# Patient Record
Sex: Female | Born: 1980 | Hispanic: No | Marital: Married | Smoking: Former smoker
Health system: Southern US, Community
[De-identification: ages and names within clinical notes are randomized; demographics above are authoritative.]

## PROBLEM LIST (undated history)

## (undated) ENCOUNTER — Inpatient Hospital Stay (HOSPITAL_COMMUNITY): Admission: EM | Payer: Medicaid Other | Source: Home / Self Care

## (undated) ENCOUNTER — Inpatient Hospital Stay (HOSPITAL_COMMUNITY): Payer: Medicaid Other

## (undated) DIAGNOSIS — K219 Gastro-esophageal reflux disease without esophagitis: Secondary | ICD-10-CM

## (undated) DIAGNOSIS — Z9889 Other specified postprocedural states: Secondary | ICD-10-CM

## (undated) DIAGNOSIS — F909 Attention-deficit hyperactivity disorder, unspecified type: Secondary | ICD-10-CM

## (undated) DIAGNOSIS — T4145XA Adverse effect of unspecified anesthetic, initial encounter: Secondary | ICD-10-CM

## (undated) DIAGNOSIS — R011 Cardiac murmur, unspecified: Secondary | ICD-10-CM

## (undated) DIAGNOSIS — K5900 Constipation, unspecified: Secondary | ICD-10-CM

## (undated) DIAGNOSIS — K8020 Calculus of gallbladder without cholecystitis without obstruction: Secondary | ICD-10-CM

## (undated) DIAGNOSIS — T8859XA Other complications of anesthesia, initial encounter: Secondary | ICD-10-CM

## (undated) DIAGNOSIS — M1990 Unspecified osteoarthritis, unspecified site: Secondary | ICD-10-CM

## (undated) DIAGNOSIS — Z98811 Dental restoration status: Secondary | ICD-10-CM

## (undated) DIAGNOSIS — R000 Tachycardia, unspecified: Secondary | ICD-10-CM

## (undated) DIAGNOSIS — D1802 Hemangioma of intracranial structures: Secondary | ICD-10-CM

## (undated) HISTORY — DX: Gastro-esophageal reflux disease without esophagitis: K21.9

## (undated) HISTORY — DX: Tachycardia, unspecified: R00.0

## (undated) HISTORY — DX: Cardiac murmur, unspecified: R01.1

## (undated) HISTORY — DX: Unspecified osteoarthritis, unspecified site: M19.90

---
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
## 2000-02-09 ENCOUNTER — Inpatient Hospital Stay (HOSPITAL_COMMUNITY): Admission: AD | Admit: 2000-02-09 | Discharge: 2000-02-09 | Payer: Self-pay | Admitting: Obstetrics & Gynecology

## 2000-03-16 ENCOUNTER — Ambulatory Visit (HOSPITAL_COMMUNITY): Admission: RE | Admit: 2000-03-16 | Discharge: 2000-03-16 | Payer: Self-pay | Admitting: Obstetrics & Gynecology

## 2000-07-30 ENCOUNTER — Inpatient Hospital Stay (HOSPITAL_COMMUNITY): Admission: AD | Admit: 2000-07-30 | Discharge: 2000-07-30 | Payer: Self-pay | Admitting: *Deleted

## 2000-08-20 ENCOUNTER — Encounter (HOSPITAL_COMMUNITY): Admission: RE | Admit: 2000-08-20 | Discharge: 2000-08-20 | Payer: Self-pay | Admitting: Obstetrics & Gynecology

## 2000-08-21 ENCOUNTER — Inpatient Hospital Stay (HOSPITAL_COMMUNITY): Admission: AD | Admit: 2000-08-21 | Discharge: 2000-08-24 | Payer: Self-pay | Admitting: *Deleted

## 2000-09-06 ENCOUNTER — Encounter (INDEPENDENT_AMBULATORY_CARE_PROVIDER_SITE_OTHER): Payer: Self-pay | Admitting: *Deleted

## 2000-10-03 ENCOUNTER — Inpatient Hospital Stay (HOSPITAL_COMMUNITY): Admission: EM | Admit: 2000-10-03 | Discharge: 2000-10-04 | Payer: Self-pay | Admitting: Emergency Medicine

## 2000-10-05 ENCOUNTER — Encounter: Admission: RE | Admit: 2000-10-05 | Discharge: 2000-10-05 | Payer: Self-pay | Admitting: Family Medicine

## 2000-12-24 ENCOUNTER — Emergency Department (HOSPITAL_COMMUNITY): Admission: EM | Admit: 2000-12-24 | Discharge: 2000-12-24 | Payer: Self-pay | Admitting: Emergency Medicine

## 2004-01-07 HISTORY — PX: LAPAROSCOPY: SHX197

## 2004-01-18 ENCOUNTER — Ambulatory Visit (HOSPITAL_COMMUNITY): Admission: RE | Admit: 2004-01-18 | Discharge: 2004-01-18 | Payer: Self-pay | Admitting: Obstetrics and Gynecology

## 2004-03-16 ENCOUNTER — Inpatient Hospital Stay (HOSPITAL_COMMUNITY): Admission: AD | Admit: 2004-03-16 | Discharge: 2004-03-16 | Payer: Self-pay | Admitting: Obstetrics & Gynecology

## 2004-11-12 ENCOUNTER — Ambulatory Visit: Payer: Self-pay | Admitting: Family Medicine

## 2004-11-27 ENCOUNTER — Ambulatory Visit: Payer: Self-pay | Admitting: *Deleted

## 2005-02-17 ENCOUNTER — Ambulatory Visit: Payer: Self-pay | Admitting: Internal Medicine

## 2005-03-13 ENCOUNTER — Ambulatory Visit: Payer: Self-pay | Admitting: Family Medicine

## 2005-10-28 ENCOUNTER — Ambulatory Visit: Payer: Self-pay | Admitting: Family Medicine

## 2006-02-09 ENCOUNTER — Ambulatory Visit: Payer: Self-pay | Admitting: Family Medicine

## 2006-02-23 ENCOUNTER — Ambulatory Visit: Payer: Self-pay | Admitting: Family Medicine

## 2006-03-06 ENCOUNTER — Encounter (INDEPENDENT_AMBULATORY_CARE_PROVIDER_SITE_OTHER): Payer: Self-pay | Admitting: *Deleted

## 2006-08-19 ENCOUNTER — Ambulatory Visit: Payer: Self-pay | Admitting: Internal Medicine

## 2006-08-25 ENCOUNTER — Inpatient Hospital Stay (HOSPITAL_COMMUNITY): Admission: AD | Admit: 2006-08-25 | Discharge: 2006-08-25 | Payer: Self-pay | Admitting: Obstetrics & Gynecology

## 2006-08-26 ENCOUNTER — Ambulatory Visit: Payer: Self-pay | Admitting: Internal Medicine

## 2006-09-23 ENCOUNTER — Encounter (INDEPENDENT_AMBULATORY_CARE_PROVIDER_SITE_OTHER): Payer: Self-pay | Admitting: *Deleted

## 2006-10-16 ENCOUNTER — Ambulatory Visit (HOSPITAL_COMMUNITY): Admission: RE | Admit: 2006-10-16 | Discharge: 2006-10-16 | Payer: Self-pay | Admitting: Obstetrics & Gynecology

## 2007-02-04 ENCOUNTER — Ambulatory Visit (HOSPITAL_COMMUNITY): Admission: RE | Admit: 2007-02-04 | Discharge: 2007-02-04 | Payer: Self-pay | Admitting: Family Medicine

## 2007-03-04 ENCOUNTER — Inpatient Hospital Stay (HOSPITAL_COMMUNITY): Admission: AD | Admit: 2007-03-04 | Discharge: 2007-03-04 | Payer: Self-pay | Admitting: Family Medicine

## 2007-03-04 ENCOUNTER — Ambulatory Visit: Payer: Self-pay | Admitting: Advanced Practice Midwife

## 2007-03-09 ENCOUNTER — Ambulatory Visit (HOSPITAL_COMMUNITY): Admission: RE | Admit: 2007-03-09 | Discharge: 2007-03-09 | Payer: Self-pay | Admitting: Obstetrics and Gynecology

## 2007-03-14 ENCOUNTER — Ambulatory Visit: Payer: Self-pay | Admitting: Obstetrics and Gynecology

## 2007-03-14 ENCOUNTER — Inpatient Hospital Stay (HOSPITAL_COMMUNITY): Admission: AD | Admit: 2007-03-14 | Discharge: 2007-03-16 | Payer: Self-pay | Admitting: Obstetrics and Gynecology

## 2007-05-03 ENCOUNTER — Encounter (INDEPENDENT_AMBULATORY_CARE_PROVIDER_SITE_OTHER): Payer: Self-pay | Admitting: Family Medicine

## 2007-05-03 ENCOUNTER — Ambulatory Visit: Payer: Self-pay | Admitting: Internal Medicine

## 2007-05-03 LAB — CONVERTED CEMR LAB
Cholesterol: 188 mg/dL (ref 0–200)
LDL Cholesterol: 105 mg/dL — ABNORMAL HIGH (ref 0–99)
VLDL: 26 mg/dL (ref 0–40)

## 2007-05-10 ENCOUNTER — Ambulatory Visit: Payer: Self-pay | Admitting: Internal Medicine

## 2008-03-13 ENCOUNTER — Encounter (INDEPENDENT_AMBULATORY_CARE_PROVIDER_SITE_OTHER): Payer: Self-pay | Admitting: Family Medicine

## 2008-03-13 ENCOUNTER — Ambulatory Visit: Payer: Self-pay | Admitting: Family Medicine

## 2008-03-13 LAB — CONVERTED CEMR LAB
AST: 18 units/L (ref 0–37)
Albumin: 4.2 g/dL (ref 3.5–5.2)
Alkaline Phosphatase: 65 units/L (ref 39–117)
Amylase: 66 units/L (ref 0–105)
Band Neutrophils: 0 % (ref 0–10)
Basophils Absolute: 0 10*3/uL (ref 0.0–0.1)
Basophils Relative: 0 % (ref 0–1)
HDL: 77 mg/dL (ref >39)
Helicobacter Pylori Antibody-IgG: 5.8 — ABNORMAL HIGH
LDL Cholesterol: 127 mg/dL — ABNORMAL HIGH (ref 0–99)
Lymphocytes Relative: 30 % (ref 12–46)
Neutro Abs: 4.6 10*3/uL (ref 1.7–7.7)
Neutrophils Relative %: 62 % (ref 43–77)
Platelets: 280 10*3/uL (ref 150–400)
Potassium: 4.1 meq/L (ref 3.5–5.3)
Preg, Serum: NEGATIVE
Sodium: 138 meq/L (ref 135–145)
Total Bilirubin: 0.9 mg/dL (ref 0.3–1.2)
Total Protein: 6.6 g/dL (ref 6.0–8.3)
VLDL: 15 mg/dL (ref 0–40)
WBC: 7.4 10*3/uL (ref 4.0–10.5)

## 2008-03-16 ENCOUNTER — Ambulatory Visit (HOSPITAL_COMMUNITY): Admission: RE | Admit: 2008-03-16 | Discharge: 2008-03-16 | Payer: Self-pay | Admitting: Internal Medicine

## 2008-04-10 ENCOUNTER — Ambulatory Visit: Payer: Self-pay | Admitting: Internal Medicine

## 2008-04-17 ENCOUNTER — Ambulatory Visit (HOSPITAL_COMMUNITY): Admission: RE | Admit: 2008-04-17 | Discharge: 2008-04-17 | Payer: Self-pay | Admitting: Family Medicine

## 2008-04-26 ENCOUNTER — Emergency Department (HOSPITAL_COMMUNITY): Admission: EM | Admit: 2008-04-26 | Discharge: 2008-04-27 | Payer: Self-pay | Admitting: Emergency Medicine

## 2008-05-10 ENCOUNTER — Ambulatory Visit: Payer: Self-pay | Admitting: Internal Medicine

## 2008-08-18 ENCOUNTER — Ambulatory Visit: Payer: Self-pay | Admitting: Internal Medicine

## 2008-08-18 ENCOUNTER — Encounter (INDEPENDENT_AMBULATORY_CARE_PROVIDER_SITE_OTHER): Payer: Self-pay | Admitting: Adult Health

## 2008-08-18 ENCOUNTER — Telehealth (INDEPENDENT_AMBULATORY_CARE_PROVIDER_SITE_OTHER): Payer: Self-pay | Admitting: *Deleted

## 2008-08-18 LAB — CONVERTED CEMR LAB
AST: 15 units/L (ref 0–37)
Albumin: 4.4 g/dL (ref 3.5–5.2)
Alkaline Phosphatase: 54 units/L (ref 39–117)
BUN: 10 mg/dL (ref 6–23)
Basophils Absolute: 0 10*3/uL (ref 0.0–0.1)
Basophils Relative: 1 % (ref 0–1)
Creatinine, Ser: 0.8 mg/dL (ref 0.40–1.20)
Eosinophils Relative: 1 % (ref 0–5)
Glucose, Bld: 79 mg/dL (ref 70–99)
HCT: 42.3 % (ref 36.0–46.0)
Helicobacter Pylori Antibody-IgG: 2.4 — ABNORMAL HIGH
Hemoglobin: 14 g/dL (ref 12.0–15.0)
Lymphocytes Relative: 43 % (ref 12–46)
MCHC: 33.1 g/dL (ref 30.0–36.0)
Monocytes Absolute: 0.4 10*3/uL (ref 0.1–1.0)
Platelets: 288 10*3/uL (ref 150–400)
Potassium: 4.2 meq/L (ref 3.5–5.3)
RDW: 12.5 % (ref 11.5–15.5)
Total Bilirubin: 1.5 mg/dL — ABNORMAL HIGH (ref 0.3–1.2)

## 2008-09-13 ENCOUNTER — Ambulatory Visit: Payer: Self-pay | Admitting: Internal Medicine

## 2008-09-13 ENCOUNTER — Encounter (INDEPENDENT_AMBULATORY_CARE_PROVIDER_SITE_OTHER): Payer: Self-pay | Admitting: Adult Health

## 2008-09-13 ENCOUNTER — Encounter (INDEPENDENT_AMBULATORY_CARE_PROVIDER_SITE_OTHER): Payer: Self-pay | Admitting: *Deleted

## 2008-09-13 LAB — CONVERTED CEMR LAB
Chlamydia, DNA Probe: NEGATIVE
GC Probe Amp, Genital: NEGATIVE

## 2008-09-14 ENCOUNTER — Encounter (INDEPENDENT_AMBULATORY_CARE_PROVIDER_SITE_OTHER): Payer: Self-pay | Admitting: *Deleted

## 2008-09-14 LAB — CONVERTED CEMR LAB
Cholesterol: 207 mg/dL — ABNORMAL HIGH (ref 0–200)
HDL: 71 mg/dL (ref >39)
LDL Cholesterol: 121 mg/dL — ABNORMAL HIGH (ref 0–99)
Triglycerides: 73 mg/dL (ref <150)

## 2008-09-19 ENCOUNTER — Ambulatory Visit: Payer: Self-pay | Admitting: Family Medicine

## 2008-09-19 ENCOUNTER — Telehealth (INDEPENDENT_AMBULATORY_CARE_PROVIDER_SITE_OTHER): Payer: Self-pay | Admitting: *Deleted

## 2008-09-21 ENCOUNTER — Ambulatory Visit (HOSPITAL_COMMUNITY): Admission: RE | Admit: 2008-09-21 | Discharge: 2008-09-21 | Payer: Self-pay | Admitting: Family Medicine

## 2008-10-05 ENCOUNTER — Ambulatory Visit: Payer: Self-pay | Admitting: Family Medicine

## 2009-01-12 ENCOUNTER — Ambulatory Visit (HOSPITAL_COMMUNITY): Admission: RE | Admit: 2009-01-12 | Discharge: 2009-01-12 | Payer: Self-pay | Admitting: Neurology

## 2009-03-01 ENCOUNTER — Ambulatory Visit: Payer: Self-pay | Admitting: Family Medicine

## 2009-03-02 ENCOUNTER — Encounter (INDEPENDENT_AMBULATORY_CARE_PROVIDER_SITE_OTHER): Payer: Self-pay | Admitting: Family Medicine

## 2009-04-20 ENCOUNTER — Telehealth (INDEPENDENT_AMBULATORY_CARE_PROVIDER_SITE_OTHER): Payer: Self-pay | Admitting: *Deleted

## 2009-04-20 ENCOUNTER — Ambulatory Visit: Payer: Self-pay | Admitting: Internal Medicine

## 2009-05-02 ENCOUNTER — Ambulatory Visit: Payer: Self-pay | Admitting: Family Medicine

## 2009-05-02 ENCOUNTER — Encounter (INDEPENDENT_AMBULATORY_CARE_PROVIDER_SITE_OTHER): Payer: Self-pay | Admitting: Family Medicine

## 2009-05-02 LAB — CONVERTED CEMR LAB
Basophils Relative: 1 % (ref 0–1)
CRP: 0.1 mg/dL (ref <0.6)
Eosinophils Relative: 2 % (ref 0–5)
HCT: 41.5 % (ref 36.0–46.0)
Hemoglobin: 13.9 g/dL (ref 12.0–15.0)
MCHC: 33.5 g/dL (ref 30.0–36.0)
Monocytes Absolute: 0.4 10*3/uL (ref 0.1–1.0)
Monocytes Relative: 6 % (ref 3–12)
Neutro Abs: 4.1 10*3/uL (ref 1.7–7.7)
RBC: 4.57 M/uL (ref 3.87–5.11)
RDW: 12.9 % (ref 11.5–15.5)
Sed Rate: 3 mm/hr (ref 0–22)

## 2009-08-30 ENCOUNTER — Ambulatory Visit: Payer: Self-pay | Admitting: Family Medicine

## 2009-08-30 LAB — CONVERTED CEMR LAB
Cholesterol: 259 mg/dL — ABNORMAL HIGH (ref 0–200)
Hemoglobin: 14.5 g/dL (ref 12.0–15.0)
Lymphs Abs: 2.5 10*3/uL (ref 0.7–4.0)
MCV: 95.3 fL (ref 78.0–100.0)
Monocytes Absolute: 0.6 10*3/uL (ref 0.1–1.0)
Monocytes Relative: 10 % (ref 3–12)
Neutro Abs: 3.2 10*3/uL (ref 1.7–7.7)
Neutrophils Relative %: 50 % (ref 43–77)
RBC: 4.9 M/uL (ref 3.87–5.11)
Total CHOL/HDL Ratio: 3.9
Triglycerides: 90 mg/dL (ref <150)
VLDL: 18 mg/dL (ref 0–40)
WBC: 6.4 10*3/uL (ref 4.0–10.5)

## 2009-08-31 ENCOUNTER — Ambulatory Visit (HOSPITAL_COMMUNITY): Admission: RE | Admit: 2009-08-31 | Discharge: 2009-08-31 | Payer: Self-pay | Admitting: Family Medicine

## 2009-11-20 IMAGING — CT CT HEAD WO/W CM
3 series · 18 of 30 positions shown, 20 images · IV contrast (agent unspecified)
Comparison: None

CLINICAL DATA: Evaluate dizziness with headaches/parasthesias.  Had
syncopal episode recently.  Also complains of blurred vision.

CT HEAD WITHOUT AND WITH CONTRAST
TECHNIQUE: Contiguous axial images were obtained from the base of
the skull through the vertex without and with intravenous contrast.
Contrast: 100 ml Hmnipaque-UVV IV.

[Series 2: brain · axial · 0.47mm/px · z∈[+174,+282]mm · 8 of 28 slices shown, 10 images]
[im 4/28  brain]
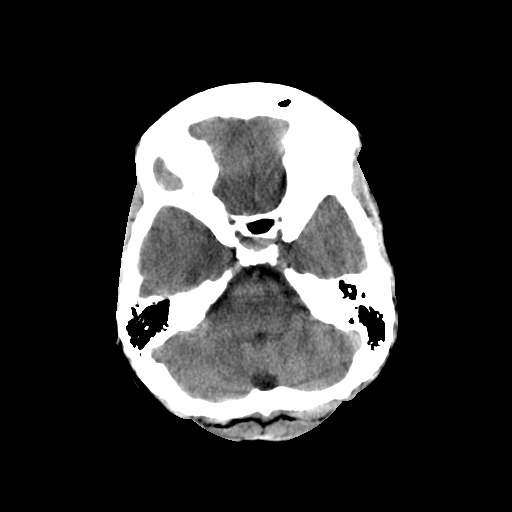
[im 4/28  bone]
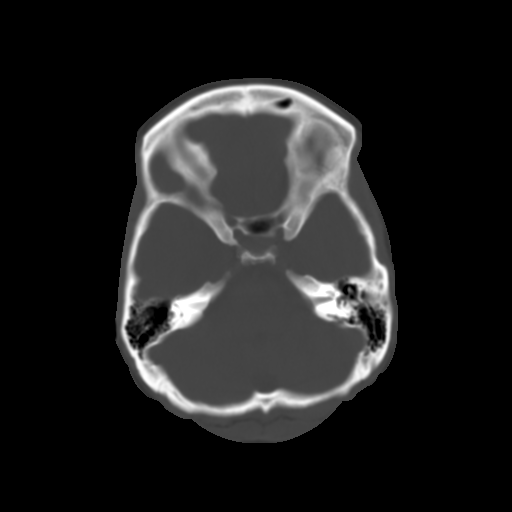
[im 7/28  brain]
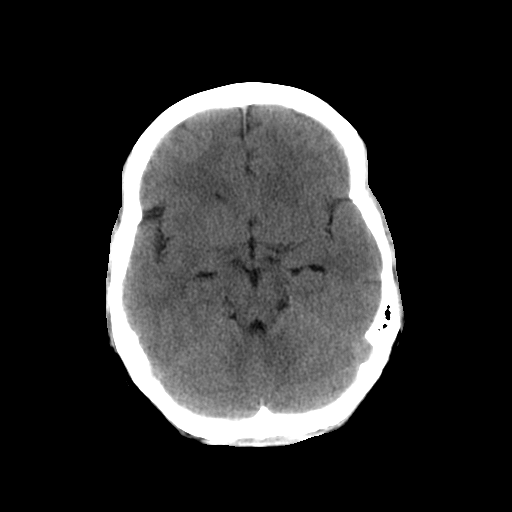
[im 10/28  brain]
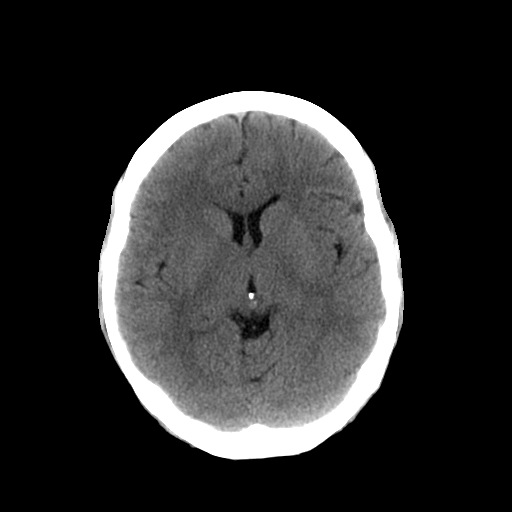
[im 13/28  brain]
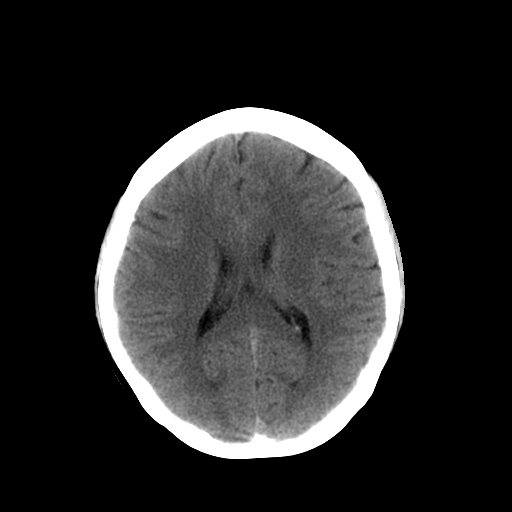
[im 16/28  brain]
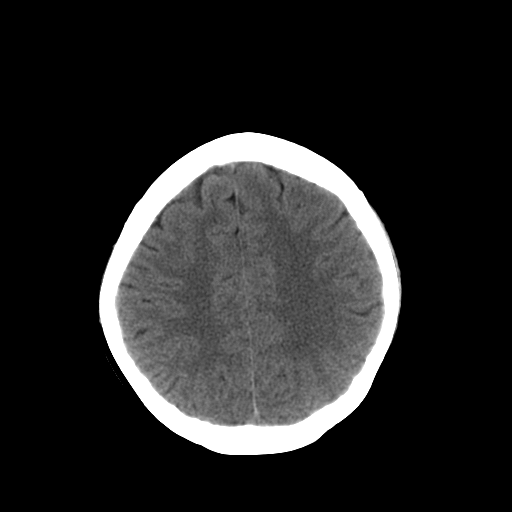
[im 16/28  bone]
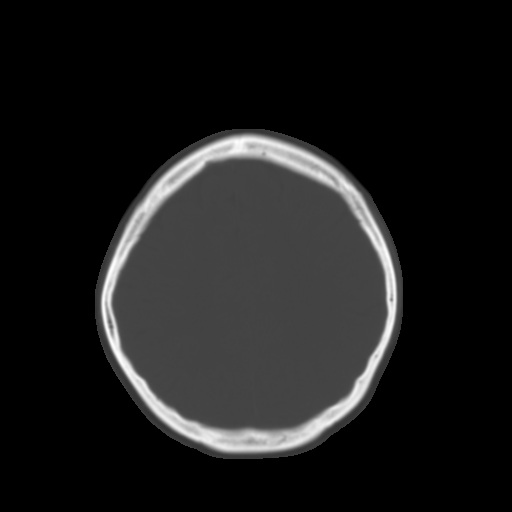
[im 19/28  brain]
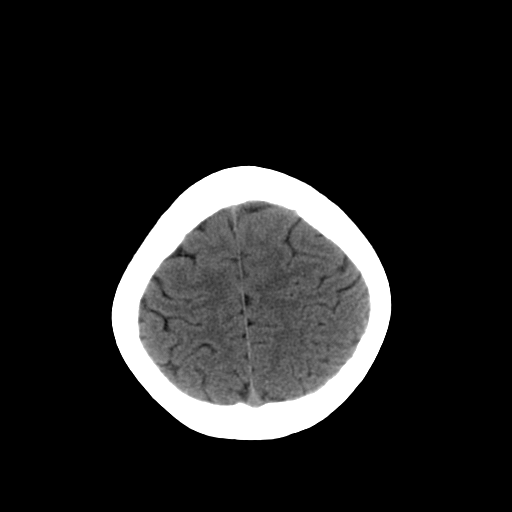
[im 22/28  brain]
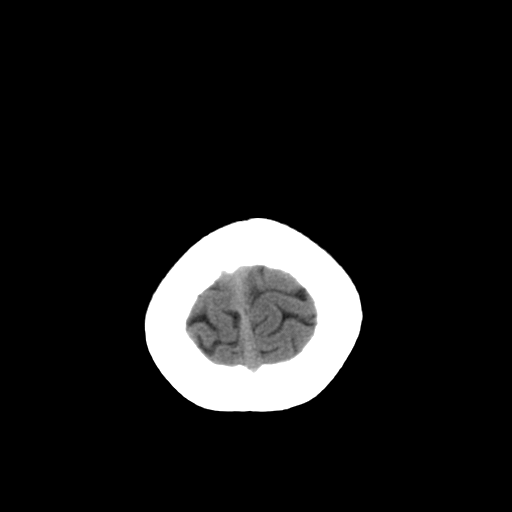
[im 25/28  brain]
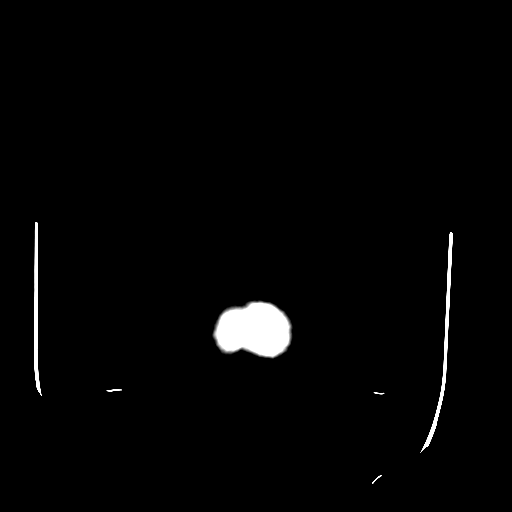

[Series 3: recon 2: brain · axial · 0.47mm/px · z∈[+174,+189]mm · 2 of 28 slices shown]
[im 4/28  brain]
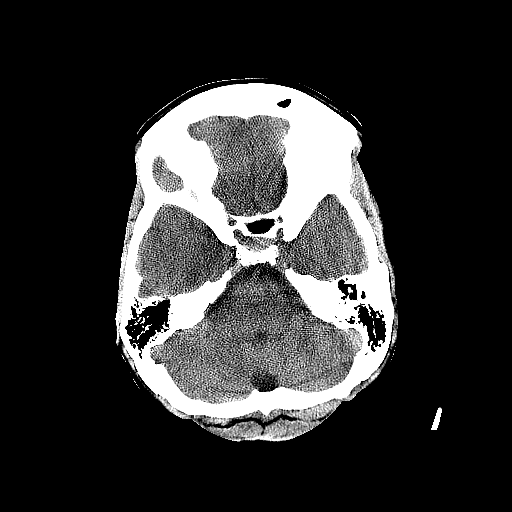
[im 7/28  brain]
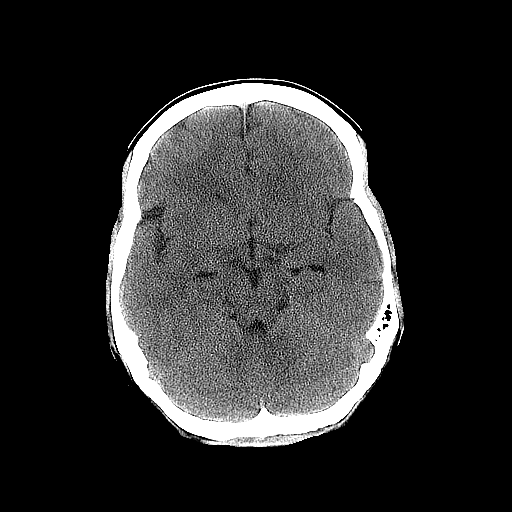

[Series 4: — · axial · 0.47mm/px · z∈[+173,+281]mm · 8 of 28 slices shown]
[im 4/28  brain]
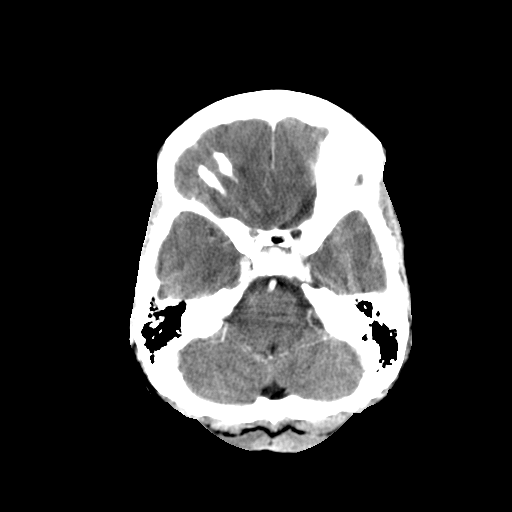
[im 7/28  brain]
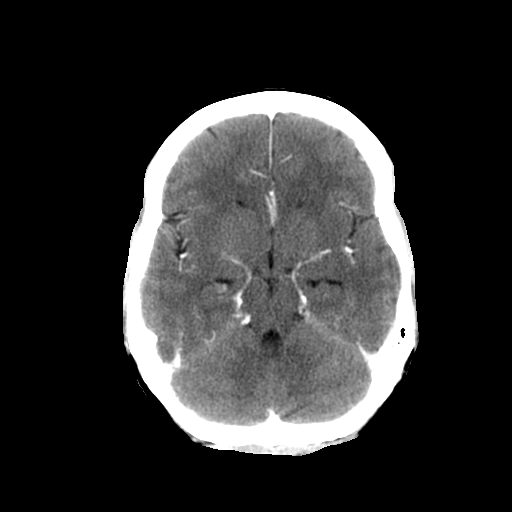
[im 10/28  brain]
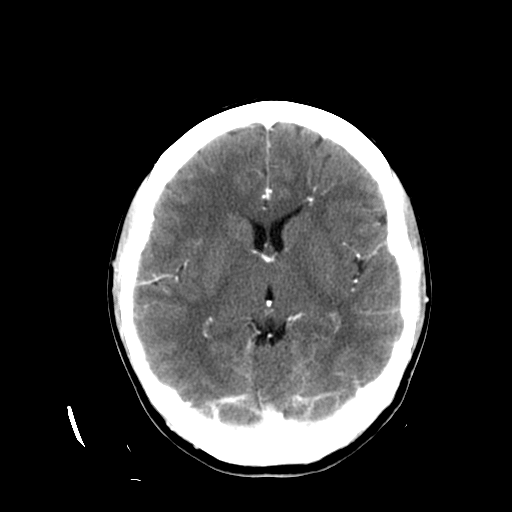
[im 13/28  brain]
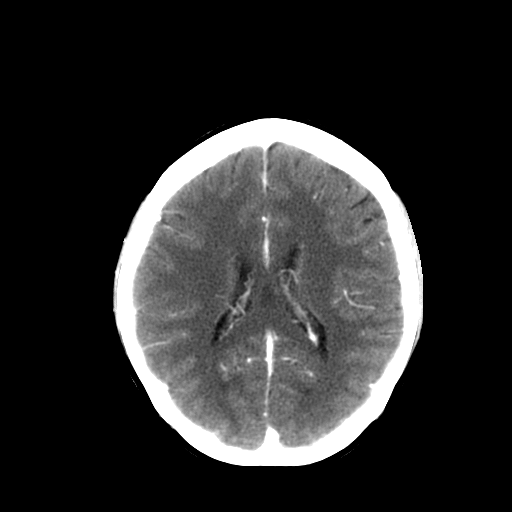
[im 16/28  brain]
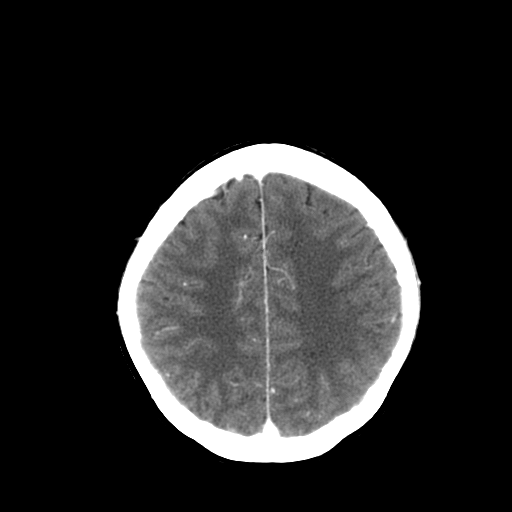
[im 19/28  brain]
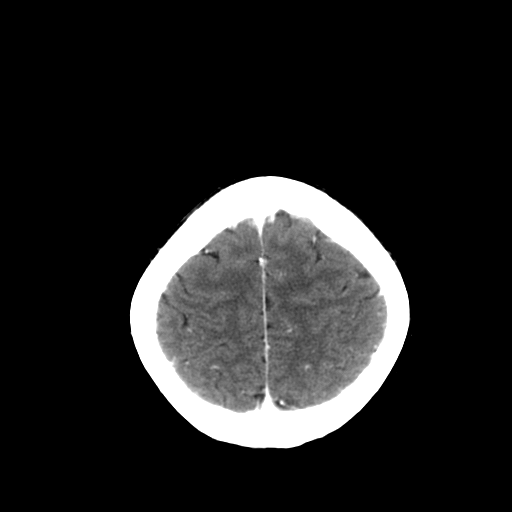
[im 22/28  brain]
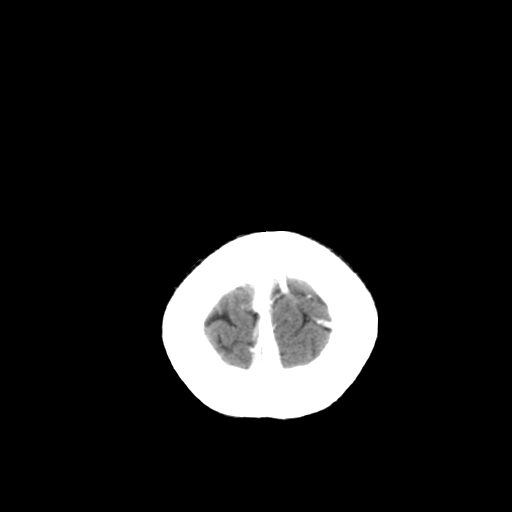
[im 25/28  brain]
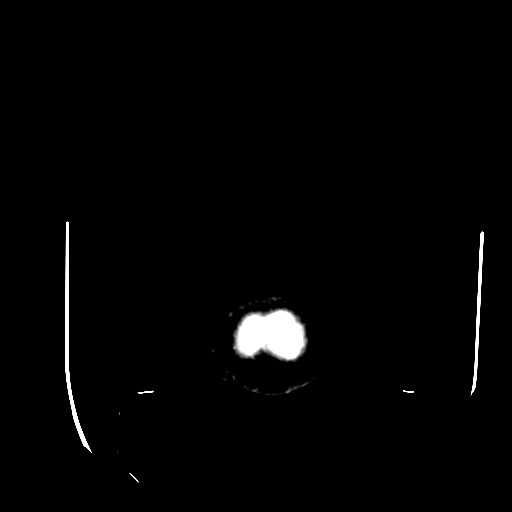

[18 of 30 positions shown; findings below may reference images not displayed]

FINDINGS: No intracranial mass lesion, hemorrhage, hydrocephalus,
or acute infarction.  There is an enhancing vascular malformation
of the left frontal lobe, typical of a venous angioma.  It extends
from the subependymal surface of the left frontal horn through the
cortex to the surface of the brain via a draining vein.  No
associated hemorrhage, atrophy, or mass effect.
IMPRESSION: Venous angioma of the left frontal lobe.

## 2010-02-05 NOTE — Progress Notes (Signed)
Summary: triage/rash possible food allergy  Phone Note Call from Patient   Caller: Patient Reason for Call: Talk to Nurse Summary of Call: patient has a rash that is very itchy...she feels it came from eating fish yesterday at lunch.Marland KitchenMarland KitchenShe says she has been taking benedryl for the itch but it isn't helping..She says the rash is on her face and denies any difficulty with breathing. Appointment made for today in open schedule. Initial call taken by: Conchita Paris,  April 20, 2009 8:52 AM

## 2010-02-10 ENCOUNTER — Emergency Department (HOSPITAL_COMMUNITY): Payer: Self-pay

## 2010-02-10 ENCOUNTER — Emergency Department (HOSPITAL_COMMUNITY)
Admission: EM | Admit: 2010-02-10 | Discharge: 2010-02-10 | Disposition: A | Discharge status: Home / Self Care | Payer: Self-pay | Attending: Emergency Medicine | Admitting: Emergency Medicine

## 2010-02-10 DIAGNOSIS — S61209A Unspecified open wound of unspecified finger without damage to nail, initial encounter: Secondary | ICD-10-CM | POA: Insufficient documentation

## 2010-02-10 DIAGNOSIS — Y92009 Unspecified place in unspecified non-institutional (private) residence as the place of occurrence of the external cause: Secondary | ICD-10-CM | POA: Insufficient documentation

## 2010-02-10 DIAGNOSIS — W268XXA Contact with other sharp object(s), not elsewhere classified, initial encounter: Secondary | ICD-10-CM | POA: Insufficient documentation

## 2010-02-20 ENCOUNTER — Other Ambulatory Visit: Payer: Self-pay | Admitting: Family Medicine

## 2010-02-20 ENCOUNTER — Encounter (INDEPENDENT_AMBULATORY_CARE_PROVIDER_SITE_OTHER): Payer: Self-pay | Admitting: *Deleted

## 2010-02-20 LAB — CONVERTED CEMR LAB
ALT: 14 units/L (ref 0–35)
BUN: 10 mg/dL (ref 6–23)
CO2: 21 meq/L (ref 19–32)
Calcium: 8.9 mg/dL (ref 8.4–10.5)
Chloride: 107 meq/L (ref 96–112)
Cholesterol: 201 mg/dL — ABNORMAL HIGH (ref 0–200)
Creatinine, Ser: 0.72 mg/dL (ref 0.40–1.20)
Glucose, Bld: 83 mg/dL (ref 70–99)
HCT: 41.2 % (ref 36.0–46.0)
Hemoglobin: 13.6 g/dL (ref 12.0–15.0)
Platelets: 269 10*3/uL (ref 150–400)
RBC: 4.45 M/uL (ref 3.87–5.11)
TSH: 1.845 microintl units/mL (ref 0.350–4.500)
Total CHOL/HDL Ratio: 3.2
Triglycerides: 53 mg/dL (ref <150)
WBC: 5.9 10*3/uL (ref 4.0–10.5)

## 2010-02-27 ENCOUNTER — Encounter (INDEPENDENT_AMBULATORY_CARE_PROVIDER_SITE_OTHER): Payer: Self-pay | Admitting: *Deleted

## 2010-03-13 IMAGING — XA IR ANGIO/CAROTID/CERV BI
1 series · 13 of 24 positions shown · IV contrast (IODINE)
Comparison: CT scan of the brain of 09/21/2008.

CLINICAL DATA: Left-sided headaches.  Suspected vascular
abnormality in the left frontal region on CT of the brain.

BILATERAL CAROTID ARTERIOGRAPHY AND BILATERAL VERTEBRAL ARTERY
ANGIOGRAMS

[Series 300: neuro · 13 of 233 slices shown]
[im 1/233]
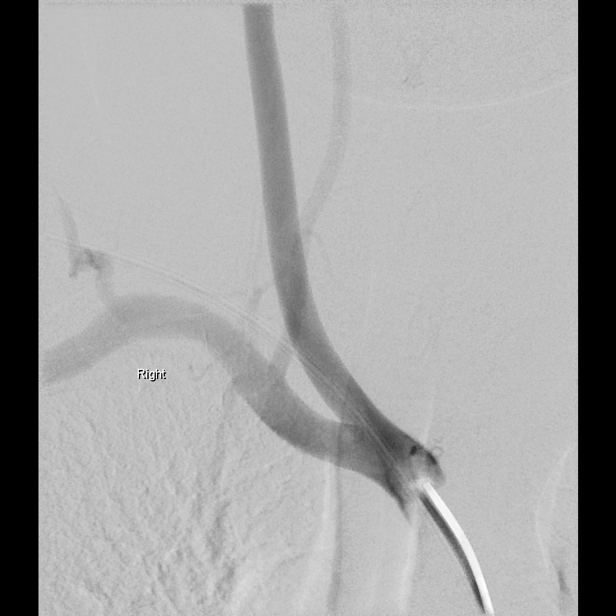
[im 21/233]
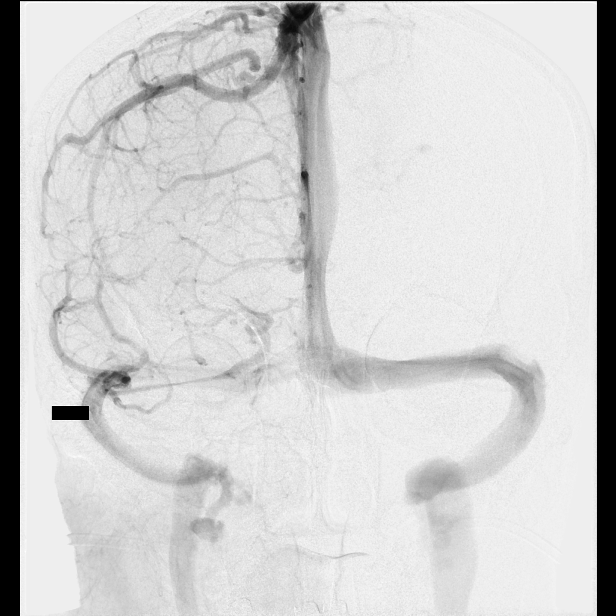
[im 41/233]
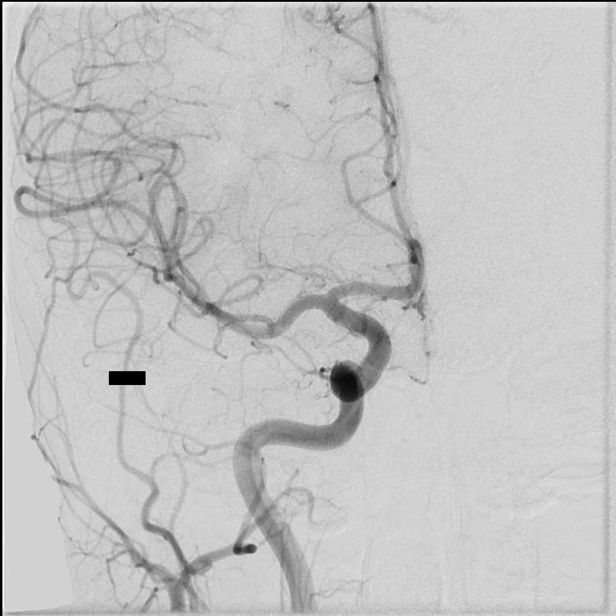
[im 61/233]
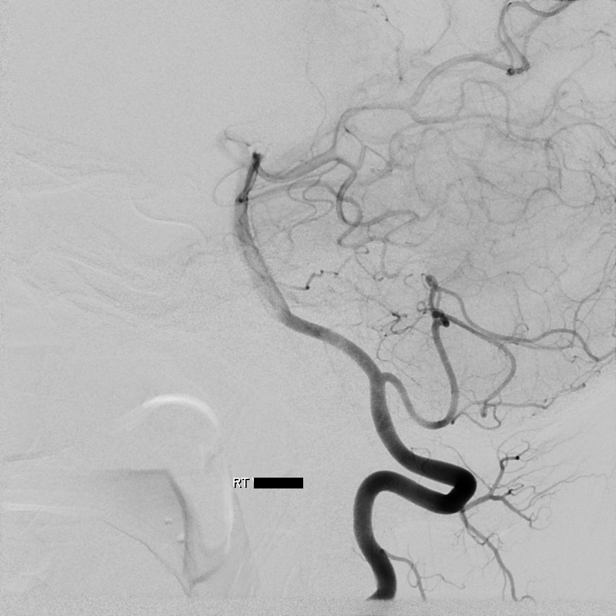
[im 81/233]
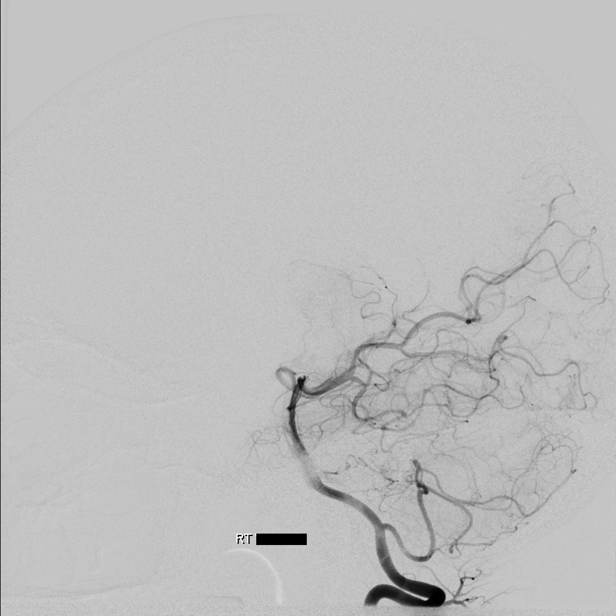
[im 101/233]
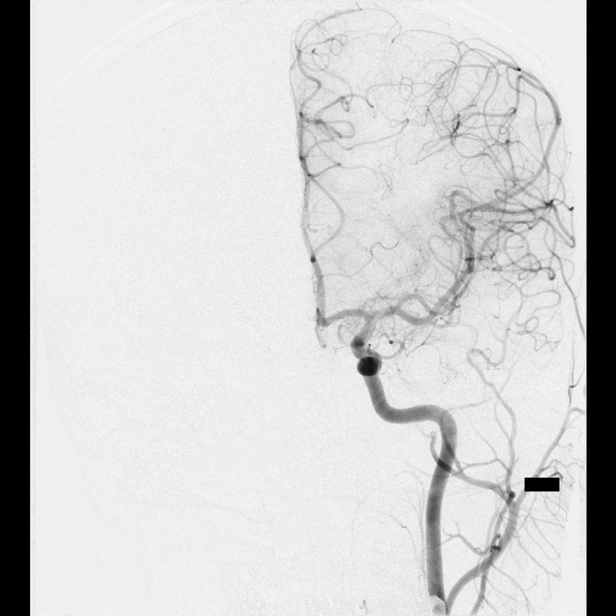
[im 122/233]
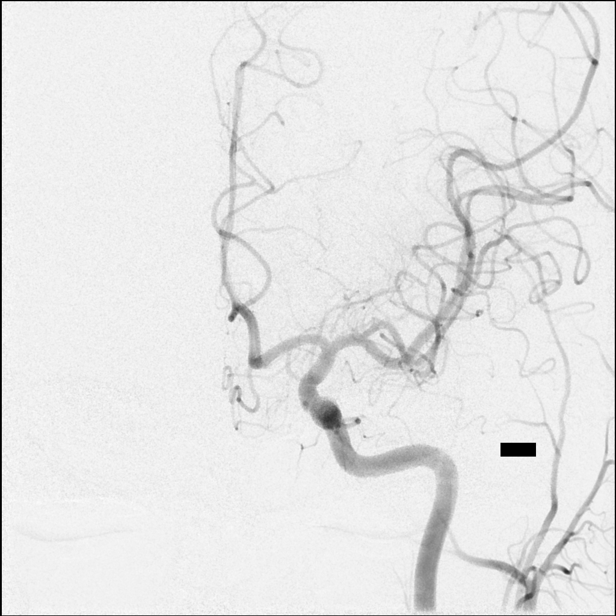
[im 132/233]
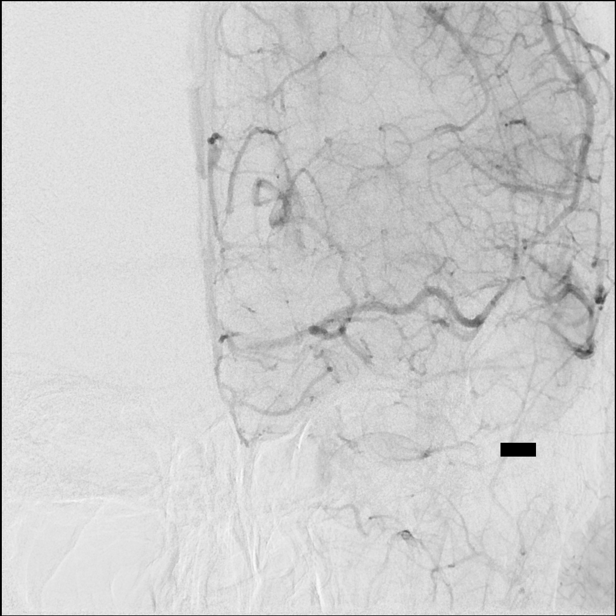
[im 152/233]
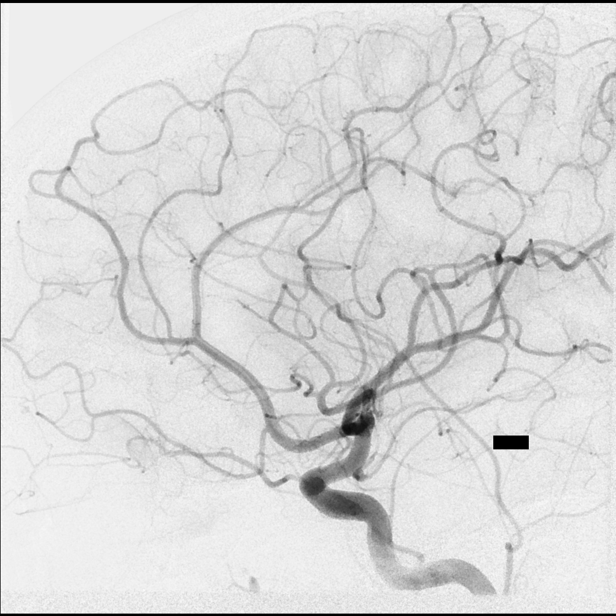
[im 172/233]
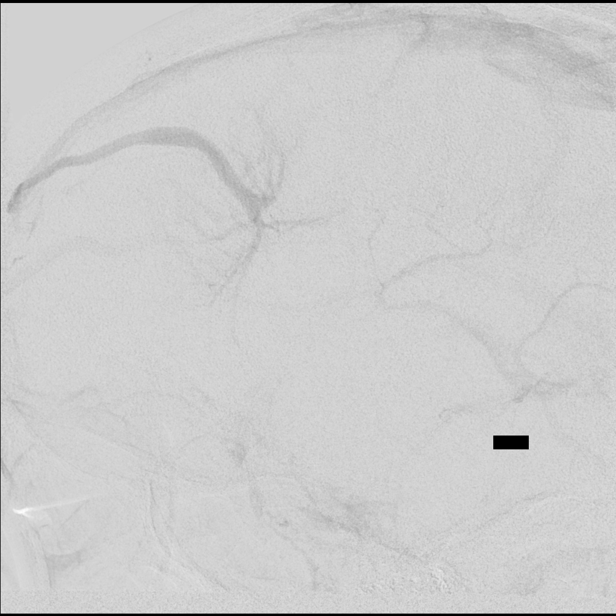
[im 192/233]
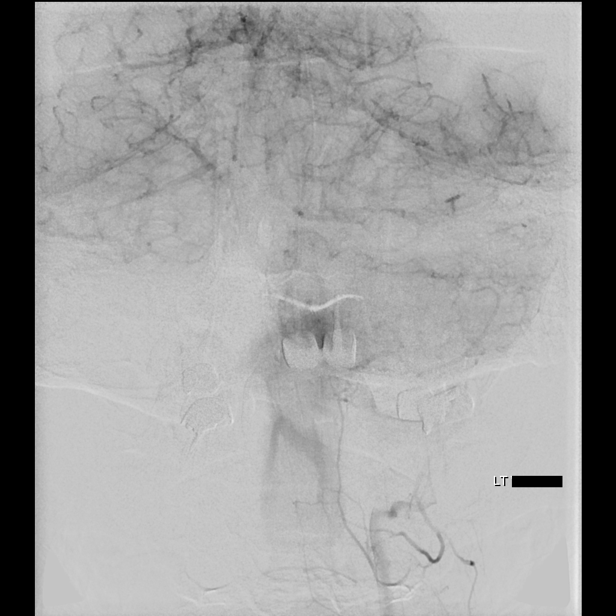
[im 212/233]
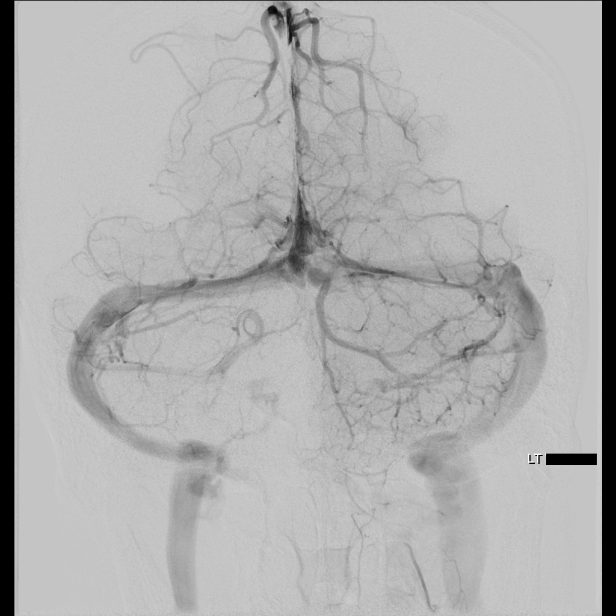
[im 233/233]
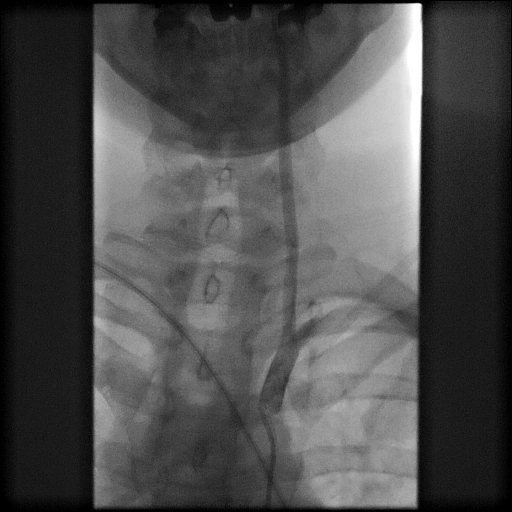

[13 of 24 positions shown; findings below may reference images not displayed]

Following a full explanation of the procedure along with the
potential associated complications, an informed witnessed consent
was obtained.

The right groin was prepped and draped in the usual sterile
fashion.  Thereafter using a modified Seldinger technique,
transfemoral access into the right common femoral artery was
obtained without difficulty.  Over a 0.035-inch guidewire, a 5-
French Pinnacle sheath was inserted.  Through this and also over a
0.035-inch guidewire, a 5-French JB1 catheter was advanced to the
aortic arch region and selectively positioned in the right common
carotid artery, the right vertebral artery, the left common carotid
artery and the left vertebral artery.

There were no acute complications. The patient tolerated the
procedure well.

Medications utilized:  0.5 mg Versed.  Fentanyl 12.5 mcg IV.

Contrast: Omnipaque 300 approximately 65 ml.
FINDINGS: The right common carotid arteriogram demonstrates the
right external carotid artery and its major branches to be normal.

The right internal carotid artery at the bulb to the cranial skull
base is normally opacified.

The petrous, the cavernous and the supraclinoid segments are
normal.

Transient opacification of the right PCA is seen via the right
posterior communicating artery.

The right middle and the right anterior cerebral arteries are seen
to opacify normally into capillary and venous phases.

Transient opacification via the anterior communicating artery of
the left anterior cerebral artery distal to the A2 segment is seen.

The right vertebral artery origin is normal.  The vessel is seen to
opacify normally to the cranial skull base.  There is normal
opacification of the right posterior inferior cerebellar artery and
the right vertebrobasilar junction.  The basilar artery, the
posterior cerebral arteries, the superior cerebellar arteries and
the anterior-inferior cerebellar arteries are seen to opacify
normally into capillary and venous phases.  Unopacified blood is
seen in the basilar artery from the contralateral vertebral artery.

The left common carotid arteriogram demonstrates the left external
carotid artery and its major branches to be normal.

The left internal carotid artery at the bulb to the cranial skull
base is normally opacified.  The petrous, the cavernous and
supraclinoid segments are normally opacified.  Transient
opacification of the left posterior communicating artery and the
left anterior choroidal artery is seen.

The left middle and the left anterior cerebral arteries are seen to
opacify normally into the capillary and venous phases.

The venous phase demonstrates the presence of numerous fine venous
channels draining into a central focus point which then leads to a
prominent venous structure projecting anteriorly over the right
subfrontal, to the right frontal cortical region where it drains
into the anterior third of the superior sagittal sinus.  No
angiographic evidence of arteriovenous shunting or of aneurysms is
seen.

The left vertebral artery origin is normal.  The vessel is seen to
opacify normally to the cranial skull base.  Normal opacification
of the left posterior inferior cerebellar artery is seen.  The left
vertebrobasilar junction, the basilar artery, the posterior
cerebral arteries, the superior cerebellar arteries and the
anterior-inferior cerebellar arteries are seen to opacify normally
into the capillary and venous phases.
IMPRESSION: 1.  Multiple small venules, converging into a central focal
subcortical prominent left frontal vein, which drains cortically
into the anterior third of the superior sagittal sinus.  The
angiographic features are most consistent with that of a venous
angioma.
2.  No angiographic evidence of a nidus or of arteriovenous
shunting to suggest an arteriovenous malformation or a dural
arteriovenous fistula is noted.
3.  No intracranial aneurysms are seen.
4.  Normal direction of venous outflow.

## 2010-03-23 LAB — COMPREHENSIVE METABOLIC PANEL
ALT: 21 U/L (ref 0–35)
Alkaline Phosphatase: 55 U/L (ref 39–117)
CO2: 27 mEq/L (ref 19–32)
Calcium: 9.3 mg/dL (ref 8.4–10.5)
GFR calc non Af Amer: >60 mL/min (ref >60)
Glucose, Bld: 89 mg/dL (ref 70–99)
Sodium: 139 mEq/L (ref 135–145)

## 2010-03-23 LAB — CBC
MCHC: 35.1 g/dL (ref 30.0–36.0)
MCV: 91.7 fL (ref 78.0–100.0)
Platelets: 225 10*3/uL (ref 150–400)
WBC: 10.1 10*3/uL (ref 4.0–10.5)

## 2010-03-23 LAB — PROTIME-INR: Prothrombin Time: 12.7 seconds (ref 11.6–15.2)

## 2010-04-17 LAB — POCT CARDIAC MARKERS
CKMB, poc: <1 ng/mL — ABNORMAL LOW (ref 1.0–8.0)
Myoglobin, poc: 46.4 ng/mL (ref 12–200)
Troponin i, poc: <0.05 ng/mL (ref 0.00–0.09)
Troponin i, poc: <0.05 ng/mL (ref 0.00–0.09)

## 2010-04-17 LAB — PROTIME-INR: INR: 1 (ref 0.00–1.49)

## 2010-04-17 LAB — RAPID URINE DRUG SCREEN, HOSP PERFORMED
Amphetamines: NOT DETECTED
Tetrahydrocannabinol: NOT DETECTED

## 2010-04-17 LAB — DIFFERENTIAL
Basophils Absolute: 0.1 10*3/uL (ref 0.0–0.1)
Eosinophils Relative: 4 % (ref 0–5)
Lymphocytes Relative: 49 % — ABNORMAL HIGH (ref 12–46)
Lymphs Abs: 3.9 10*3/uL (ref 0.7–4.0)
Neutro Abs: 3.1 10*3/uL (ref 1.7–7.7)

## 2010-04-17 LAB — CBC
HCT: 38.3 % (ref 36.0–46.0)
Hemoglobin: 13.6 g/dL (ref 12.0–15.0)
Platelets: 227 10*3/uL (ref 150–400)
RDW: 11.2 % — ABNORMAL LOW (ref 11.5–15.5)
WBC: 7.9 10*3/uL (ref 4.0–10.5)

## 2010-04-17 LAB — URINALYSIS, ROUTINE W REFLEX MICROSCOPIC
Ketones, ur: NEGATIVE mg/dL
Nitrite: NEGATIVE
Protein, ur: NEGATIVE mg/dL
Urobilinogen, UA: 0.2 mg/dL (ref 0.0–1.0)

## 2010-04-17 LAB — BASIC METABOLIC PANEL
BUN: 10 mg/dL (ref 6–23)
Calcium: 9.4 mg/dL (ref 8.4–10.5)
GFR calc non Af Amer: >60 mL/min (ref >60)
Glucose, Bld: 113 mg/dL — ABNORMAL HIGH (ref 70–99)
Potassium: 3.5 mEq/L (ref 3.5–5.1)
Sodium: 139 mEq/L (ref 135–145)

## 2010-04-17 LAB — TSH: TSH: 2.823 u[IU]/mL (ref 0.350–4.500)

## 2010-04-17 LAB — ETHANOL: Alcohol, Ethyl (B): <5 mg/dL (ref 0–10)

## 2010-04-17 LAB — MAGNESIUM: Magnesium: 2 mg/dL (ref 1.5–2.5)

## 2010-04-17 LAB — D-DIMER, QUANTITATIVE: D-Dimer, Quant: 0.55 ug/mL-FEU — ABNORMAL HIGH (ref 0.00–0.48)

## 2010-04-17 LAB — APTT: aPTT: 33 seconds (ref 24–37)

## 2010-04-27 ENCOUNTER — Inpatient Hospital Stay (HOSPITAL_COMMUNITY)
Admission: AD | Admit: 2010-04-27 | Discharge: 2010-04-27 | Disposition: A | Discharge status: Home / Self Care | Payer: Self-pay | Source: Ambulatory Visit | Attending: Obstetrics & Gynecology | Admitting: Obstetrics & Gynecology

## 2010-04-27 ENCOUNTER — Inpatient Hospital Stay (HOSPITAL_COMMUNITY): Payer: Self-pay

## 2010-04-27 DIAGNOSIS — O209 Hemorrhage in early pregnancy, unspecified: Secondary | ICD-10-CM | POA: Insufficient documentation

## 2010-04-27 DIAGNOSIS — N83209 Unspecified ovarian cyst, unspecified side: Secondary | ICD-10-CM

## 2010-04-27 DIAGNOSIS — O34599 Maternal care for other abnormalities of gravid uterus, unspecified trimester: Secondary | ICD-10-CM

## 2010-04-27 LAB — CBC
Hemoglobin: 12.5 g/dL (ref 12.0–15.0)
MCV: 89.4 fL (ref 78.0–100.0)
Platelets: 232 10*3/uL (ref 150–400)
RBC: 4.17 MIL/uL (ref 3.87–5.11)
WBC: 7.3 10*3/uL (ref 4.0–10.5)

## 2010-04-27 LAB — URINALYSIS, ROUTINE W REFLEX MICROSCOPIC
Bilirubin Urine: NEGATIVE
Glucose, UA: NEGATIVE mg/dL
Ketones, ur: NEGATIVE mg/dL
Protein, ur: NEGATIVE mg/dL
Urobilinogen, UA: 0.2 mg/dL (ref 0.0–1.0)

## 2010-04-27 LAB — URINE MICROSCOPIC-ADD ON

## 2010-04-27 LAB — WET PREP, GENITAL: Clue Cells Wet Prep HPF POC: NONE SEEN

## 2010-04-27 LAB — ABO/RH: ABO/RH(D): A POS

## 2010-04-29 LAB — GC/CHLAMYDIA PROBE AMP, GENITAL: GC Probe Amp, Genital: NEGATIVE

## 2010-05-20 ENCOUNTER — Other Ambulatory Visit (HOSPITAL_COMMUNITY): Payer: Self-pay | Admitting: Obstetrics and Gynecology

## 2010-05-20 DIAGNOSIS — O3680X0 Pregnancy with inconclusive fetal viability, not applicable or unspecified: Secondary | ICD-10-CM

## 2010-05-23 ENCOUNTER — Ambulatory Visit (HOSPITAL_COMMUNITY)
Admission: RE | Admit: 2010-05-23 | Discharge: 2010-05-23 | Disposition: A | Discharge status: Home / Self Care | Payer: Medicaid Other | Source: Ambulatory Visit | Attending: Obstetrics and Gynecology | Admitting: Obstetrics and Gynecology

## 2010-05-23 DIAGNOSIS — Z3689 Encounter for other specified antenatal screening: Secondary | ICD-10-CM | POA: Insufficient documentation

## 2010-05-23 DIAGNOSIS — O3680X0 Pregnancy with inconclusive fetal viability, not applicable or unspecified: Secondary | ICD-10-CM

## 2010-05-24 NOTE — H&P (Signed)
[place]. [facility]  Patient:    Kathleen Cooper, Kathleen Cooper Visit Number: [id number] MRN: [id number]          Service Type: OBS Location: [id number] Attending Physician:  Michaelle Copas Dictated by:   Maryelizabeth Rowan, M.D. Admit Date:  08/21/2000 Discharge Date: 08/24/2000   CC:         [facility], [address]                         History and Physical  DATE OF BIRTH:  1980-09-15.  CHIEF COMPLAINT:  Right side/back pain.  HISTORY OF PRESENT ILLNESS:  This 30 year old [place] female presented to [facility] complaining of right-sided back pain.  She states that she has had this pain for the last day and also reports a fever with this pain.  Her temperature was as high as 103.9 axillary at home.  She also complains of a slight headache, joint pain, and anorexia for the last day. She states she has not had much of an appetite but has been thirsty and been trying to hydrate herself with p.o. fluids.  She presented to Urgent Care this evening and was found to be significantly orthostatic.  Back with right-sided CVA tenderness with a positive urinalysis and increased white blood cells in the serum.  REVIEW OF SYSTEMS:  Denies URI symptoms.  Positive constitutional symptoms for anorexia.  Denies ill contacts.  NEURO:  Has had a slight headache for the last day but denies visual changes, focal weaknesses, or ataxia. MUSCULOSKELETAL:  Positive for joint pain, generalized on and off.  GU: Denies dysuria, pyuria, vaginal discharge.  CARDIAC:  Denies chest pain. RESPIRATORY:  Denies shortness of breath.  GI:  Denies abdominal pain, nausea, vomiting, diarrhea.  PAST MEDICAL HISTORY:  None.  PAST SURGICAL HISTORY:  None.  MEDICATIONS:  None.  OBSTETRICAL HISTORY:  G1, P1, status post normal spontaneous vaginal delivery six weeks ago.  Currently breastfeeding.  ALLERGIES:  No known drug allergies.  SOCIAL HISTORY:  Tobacco  use.  Denies alcohol use.  Denies social history. Lives with husband and six-week-old daughter.  Stays home with baby.  Her husbands contact number is [phone].  PHYSICAL EXAMINATION:  Blood pressure 110/59, pulse 120, respiratory rate 24, O2 saturation 98% on room air, temperature 103.3.  GENERAL:  She is in moderate distress complaining of significant right-sided pain.  She is alert and oriented x 3.  SKIN:  Pale but no ______ purpura rash.  HEENT:  Normocephalic, atraumatic.  PERRLA.  EOMI.  Sclerae anicteric. Oropharynx nonerythematous without exudate.  Dry mucous membranes, good dentition.  NECK:  Supple.  No LATM or JVD.  LUNGS:  CTA.  No WRR.  Good inspiratory effort.  Normal IE ratio.  HEART:  RRR, 3/6 systolic ejection murmur.  No rubs or gallops appreciated.  ABDOMEN:  Positive BS.  Soft, NT/ND.  No HSM.  No suprapubic tenderness.  BACK:  Right-sided CVA tenderness.  No spinal tenderness.  Full range of motion.  EXTREMITIES:  Full range of motion.  Strength 4/5, equal bilaterally.  No clubbing, cyanosis, or edema.  NEURO:  Cranial nerves 2-12 intact.  Reflexes 2/4 and equal bilaterally.  LABORATORY:  Urinalysis significant for a nitrite, positive LE, positive white blood cells in the urine 25-30 with 2+ bacteria.  White blood cell count 19.5, hemoglobin 13.3, hematocrit 39.3, platelets 208, ANC 14.4.  Orthostatics in Urgent Care include lying  90/50, pulse 140; sitting 84/48, pulse 139; standing faint feeling, BP not obtainable.  After two liters of IV fluid in the ER, orthostatics, lying 87/36, pulse 102; sitting 83/39, pulse 117; and standing 91/52, pulse 124.  Labs pending include urine culture, blood culture, and urine pregnancy test.  ASSESSMENT AND PLAN: 1. Pyelonephritis.  Treat with IV Cipro 400 mg q.6h., Demerol 25 mg IV q.3-4h.    p.r.n., Tylenol 650 mg p.o. p.r.n. fever. 2. Orthostatic hypotension.  Give aggressive IV hydration as well as check     blood cultures.  Recommend bed rest and rechecking orthostatics in the    morning. 3. Dehydration.  Continue aggressive IV fluids as above. Dictated by:   Maryelizabeth Rowan, M.D. Attending Physician:  Michaelle Copas DD:  10/03/00 TD:  10/03/00 Job: [id number]

## 2010-05-24 NOTE — Discharge Summary (Signed)
[place]. [facility]  Patient:    Kathleen Cooper, Kathleen Cooper Visit Number: [id number] MRN: [id number]          Service Type: MED Location: [id number] Attending Physician:  Sanjuana Letters Dictated by:   Ocie Doyne, M.D. Admit Date:  10/03/2000   CC:         [facility]  on [address]   Discharge Summary  DISCHARGE DIAGNOSIS:  Pyelonephritis.  DISCHARGE MEDICATIONS: 1. Tylenol 650 mg p.o. q.4-6h. p.r.n. 2. Rocephin 2 gm IM q.d. until afebrile for 48 hours and then we will    begin p.o. antibiotics based on cultures and sensitivity.  HISTORY OF PRESENT ILLNESS:  This 30 year old [place] female [facility] complaining of pain in her right side and back for 24 hours as well as a fever to 103.9 and a 24-hour history of headache, anorexia, and arthralgias.  She was found to be orthostatic with leukocytosis and a urinalysis suggestive of infection and was transferred to [facility] for admission and treatment of pyelonephritis.  Of note, she is six weeks postpartum from a spontaneous vaginal delivery of a termed female infant and is breast feeding.  At admission, she was febrile to 103.3.  She was having significant right-sided pain and had dry, moist mucosa with tachycardia and hypotension as well as right-sided CVA tenderness.  Admission labs were significant for a urinalysis positive for leukocyte esterase and nitrites.  Microscopic exam revealing 25-30 white blood cells and 2+ bacteria.  Her white count was 19.5 with a left shift and urine pregnancy test was negative.  Blood and urine cultures were collected.  HOSPITAL COURSE:  Pyelonephritis:  The patient was initially started on Cipro IV and aggressively rehydrated which she tolerated well.  She continued to experience high fevers overnight up to 104.5.  The following morning, she stated that she was feeling well and wished to be  discharged home.  I reiterated to the patient that the standard care for pyelonephritis is to treat with parenteral antibiotics until the patient has been afebrile for greater than 48 hours and then to switch to oral antibiotics and discharge home.  I explained to her that I did not feel she was stable for discharge based on a hospital stay of less than 24 hours.  Her concerns included the cost of the hospital stay and she does not have health insurance as well as returning home as soon as possible because she is breast-feeding a 46-week-old infant and is the primary care taker for her child.  Cipro was discontinued and she received a dose of Rocephin on October 04, 2000 at 10:45 a.m.  The most likely pathogen causing her pyelonephritis is E. Coli but Rocephin would not provide adequate coverage for enterococci which was also concerning.  Therefore, a urine Gram-stain was obtained revealing a moderate amount of gram-negative rods and a few gram-variable rods.  A few white cells were seen.  No cocci were identified.  I contacted the microbiology lab later in the afternoon and was informed that her urine culture sample had only arrived at 1 p.m. that day and there had been very little growth but the pattern seemed to be typical of gram-negative rods.  After sharing this information with the patient and her husband, they reiterated their feeling that she needed to be discharged even against medical advise.  They did agree to return to the [facility] on a daily basis to  receive Rocephin 2 gm IM each morning so that her pyelonephritis and possible bacteremia could be treated.  I reviewed with them the risks of early discharge including recurrent infection, fever, dehydration, and need for second hospitalization.  DISCHARGE INSTRUCTIONS:  Discharge instructions were provided to the patient as well as a number to call in the event that her condition decompensated and they  understand that they can return to the emergency room and will be treated regardless of their ability to pay.  The patient is advised that she may continue breast-feeding and the Rocephin is generally considered to be safe in breast-feeding.  She will return for follow-up at the [facility] on Monday, October 05, 2000 between 10 a.m. and 12 noon and follow-up has been arranged at the clinic following treatment for this acute episode of pyelonephritis. She will return to [facility] for ongoing primary care. Dictated by:   Ocie Doyne, M.D. Attending Physician:  Sanjuana Letters DD:  10/04/00 TD:  10/04/00 Job: [id number]

## 2010-05-30 ENCOUNTER — Other Ambulatory Visit (HOSPITAL_COMMUNITY): Payer: Self-pay | Admitting: Obstetrics and Gynecology

## 2010-05-30 DIAGNOSIS — Z3682 Encounter for antenatal screening for nuchal translucency: Secondary | ICD-10-CM

## 2010-05-30 LAB — HEPATITIS B SURFACE ANTIGEN: Hepatitis B Surface Ag: NEGATIVE

## 2010-05-30 LAB — RUBELLA ANTIBODY, IGM: Rubella: IMMUNE

## 2010-05-30 LAB — ANTIBODY SCREEN: Antibody Screen: NEGATIVE

## 2010-06-14 ENCOUNTER — Ambulatory Visit (HOSPITAL_COMMUNITY)
Admission: RE | Admit: 2010-06-14 | Discharge: 2010-06-14 | Disposition: A | Discharge status: Home / Self Care | Payer: Medicaid Other | Source: Ambulatory Visit | Attending: Obstetrics and Gynecology | Admitting: Obstetrics and Gynecology

## 2010-06-14 ENCOUNTER — Encounter (HOSPITAL_COMMUNITY): Payer: Self-pay

## 2010-06-14 DIAGNOSIS — O351XX0 Maternal care for (suspected) chromosomal abnormality in fetus, not applicable or unspecified: Secondary | ICD-10-CM | POA: Insufficient documentation

## 2010-06-14 DIAGNOSIS — Z3682 Encounter for antenatal screening for nuchal translucency: Secondary | ICD-10-CM

## 2010-06-14 DIAGNOSIS — Z3689 Encounter for other specified antenatal screening: Secondary | ICD-10-CM | POA: Insufficient documentation

## 2010-06-14 DIAGNOSIS — O3510X0 Maternal care for (suspected) chromosomal abnormality in fetus, unspecified, not applicable or unspecified: Secondary | ICD-10-CM | POA: Insufficient documentation

## 2010-07-05 ENCOUNTER — Ambulatory Visit (HOSPITAL_COMMUNITY)
Admission: RE | Admit: 2010-07-05 | Discharge: 2010-07-05 | Disposition: A | Discharge status: Home / Self Care | Payer: Medicaid Other | Source: Ambulatory Visit | Attending: Obstetrics and Gynecology | Admitting: Obstetrics and Gynecology

## 2010-07-05 DIAGNOSIS — O3510X0 Maternal care for (suspected) chromosomal abnormality in fetus, unspecified, not applicable or unspecified: Secondary | ICD-10-CM | POA: Insufficient documentation

## 2010-07-05 DIAGNOSIS — O351XX0 Maternal care for (suspected) chromosomal abnormality in fetus, not applicable or unspecified: Secondary | ICD-10-CM | POA: Insufficient documentation

## 2010-07-15 ENCOUNTER — Inpatient Hospital Stay (HOSPITAL_COMMUNITY)
Admission: AD | Admit: 2010-07-15 | Discharge: 2010-07-15 | Disposition: A | Discharge status: Home / Self Care | Payer: Medicaid Other | Source: Ambulatory Visit | Attending: Obstetrics and Gynecology | Admitting: Obstetrics and Gynecology

## 2010-07-15 ENCOUNTER — Inpatient Hospital Stay (HOSPITAL_COMMUNITY)
Admission: AD | Admit: 2010-07-15 | Payer: Medicaid Other | Source: Ambulatory Visit | Admitting: Obstetrics and Gynecology

## 2010-07-15 ENCOUNTER — Encounter (HOSPITAL_COMMUNITY): Payer: Self-pay | Admitting: *Deleted

## 2010-07-15 DIAGNOSIS — B9789 Other viral agents as the cause of diseases classified elsewhere: Secondary | ICD-10-CM | POA: Insufficient documentation

## 2010-07-15 DIAGNOSIS — B349 Viral infection, unspecified: Secondary | ICD-10-CM

## 2010-07-15 DIAGNOSIS — M549 Dorsalgia, unspecified: Secondary | ICD-10-CM | POA: Insufficient documentation

## 2010-07-15 DIAGNOSIS — O99891 Other specified diseases and conditions complicating pregnancy: Secondary | ICD-10-CM | POA: Insufficient documentation

## 2010-07-15 LAB — CBC
MCH: 30.7 pg (ref 26.0–34.0)
MCHC: 36 g/dL (ref 30.0–36.0)
MCV: 85.3 fL (ref 78.0–100.0)
Platelets: 174 10*3/uL (ref 150–400)
RBC: 3.94 MIL/uL (ref 3.87–5.11)
RDW: 12.3 % (ref 11.5–15.5)

## 2010-07-15 LAB — URINALYSIS, ROUTINE W REFLEX MICROSCOPIC
Bilirubin Urine: NEGATIVE
Nitrite: NEGATIVE
Protein, ur: NEGATIVE mg/dL
Specific Gravity, Urine: 1.01 (ref 1.005–1.030)
Urobilinogen, UA: 0.2 mg/dL (ref 0.0–1.0)

## 2010-07-15 LAB — DIFFERENTIAL
Basophils Absolute: 0 10*3/uL (ref 0.0–0.1)
Basophils Relative: 0 % (ref 0–1)
Eosinophils Absolute: 0 10*3/uL (ref 0.0–0.7)
Eosinophils Relative: 0 % (ref 0–5)
Lymphs Abs: 0.6 10*3/uL — ABNORMAL LOW (ref 0.7–4.0)
Neutrophils Relative %: 87 % — ABNORMAL HIGH (ref 43–77)

## 2010-07-15 LAB — RAPID STREP SCREEN (MED CTR MEBANE ONLY): Streptococcus, Group A Screen (Direct): NEGATIVE

## 2010-07-15 MED ORDER — ACETAMINOPHEN 500 MG PO TABS
1000.0000 mg | ORAL_TABLET | Freq: Four times a day (QID) | ORAL | Status: DC | PRN
  Administered 2010-07-15: 1000 mg via ORAL
  Filled 2010-07-15: qty 2

## 2010-07-15 NOTE — Consult Note (Signed)
Reviewed HPI/Exam/Labs>DC to home, encourage tylenol and rest.

## 2010-07-15 NOTE — ED Notes (Signed)
W. Muhammad, CNM at bedside.  Assessment done and poc discussed with pt.   

## 2010-07-15 NOTE — Initial Assessments (Signed)
Pt presents to [facility] with c/o body aches that started yesterday.  Became worse tonight.  Pt took tylenol at 10pm.  No relief obtained.

## 2010-07-15 NOTE — ED Provider Notes (Signed)
History     Chief Complaint  Patient presents with  . Back Pain   Back Pain This is a new problem. The current episode started yesterday. The problem occurs constantly. The problem has been gradually worsening since onset. The pain is present in the lumbar spine. The quality of the pain is described as aching and burning. The pain is at a severity of 10/10. The pain is moderate. The pain is the same all the time. The symptoms are aggravated by lying down. Associated symptoms include abdominal pain and a fever. Pertinent negatives include no dysuria.   Pt reports back pain that started two days ago.  Denies UTI symptoms, vaginal bleeding or leaking of fluid.  +generalized body aches and chills.  Denies cough, nausea, vomiting, or diarrhea.      Past Medical History  Diagnosis Date  . No pertinent past medical history     Past Surgical History  Procedure Date  . No past surgeries     No family history on file.  History  Substance Use Topics  . Smoking status: Never Smoker   . Smokeless tobacco: Not on file  . Alcohol Use: No    Allergies: No Known Allergies   (Not in a hospital admission)  Review of Systems  Constitutional: Positive for fever, chills and malaise/fatigue.  HENT: Positive for congestion and sore throat. Negative for ear pain and neck pain.   Eyes: Negative.   Respiratory: Negative.   Cardiovascular: Negative.   Gastrointestinal: Positive for abdominal pain and diarrhea. Negative for nausea and vomiting.  Genitourinary: Positive for urgency and flank pain. Negative for dysuria and hematuria.  Musculoskeletal: Positive for myalgias, back pain and joint pain.  Skin: Negative.   Neurological: Negative.    Physical Exam   Blood pressure 110/60, pulse 111, temperature 100.2 F (37.9 C), temperature source Oral, resp. rate 22, height 5\' 1"  (1.549 m), weight 70.761 kg (156 lb), last menstrual period 03/20/2010.  Physical Exam  Constitutional: She is  oriented to person, place, and time. She appears well-developed and well-nourished. She appears ill.  HENT:  Head: Normocephalic.  Mouth/Throat: Mucous membranes are not dry.       Tonsils enlarged; red  Neck: Normal range of motion. Neck supple.  Cardiovascular: Normal rate, regular rhythm and normal heart sounds.  Exam reveals no gallop and no friction rub.   No murmur heard. Respiratory: Effort normal and breath sounds normal. No respiratory distress.  GI: She exhibits no mass. There is tenderness (suprapubic) in the suprapubic area. There is CVA tenderness. There is no rebound and no guarding.       FHR 160's  Neurological: She is alert and oriented to person, place, and time.  Skin: She is diaphoretic.    MAU Course  Procedures  MDM [facility]

## 2010-07-15 NOTE — Progress Notes (Signed)
Roney Marion notified of pt arrival.  Notified of complaints.  Order UA and call when resulted.

## 2010-07-15 NOTE — Progress Notes (Signed)
Roney Marion, CNM notified of lab results.  Will call private for orders.

## 2010-07-15 NOTE — Progress Notes (Signed)
Her whole body hurts, cant sleep or sit - started this morning, much worse now.  Took tylenol @ 2200 325mg . 16wks preg, g3p2

## 2010-09-26 ENCOUNTER — Encounter: Payer: Self-pay | Admitting: Cardiology

## 2010-09-27 ENCOUNTER — Encounter: Payer: Self-pay | Admitting: Cardiology

## 2010-09-27 ENCOUNTER — Ambulatory Visit (INDEPENDENT_AMBULATORY_CARE_PROVIDER_SITE_OTHER): Payer: Medicaid Other | Admitting: Cardiology

## 2010-09-27 DIAGNOSIS — R002 Palpitations: Secondary | ICD-10-CM | POA: Insufficient documentation

## 2010-09-27 DIAGNOSIS — R011 Cardiac murmur, unspecified: Secondary | ICD-10-CM | POA: Insufficient documentation

## 2010-09-27 DIAGNOSIS — R0602 Shortness of breath: Secondary | ICD-10-CM | POA: Insufficient documentation

## 2010-09-27 DIAGNOSIS — R079 Chest pain, unspecified: Secondary | ICD-10-CM | POA: Insufficient documentation

## 2010-09-27 HISTORY — DX: Palpitations: R00.2

## 2010-09-27 HISTORY — DX: Shortness of breath: R06.02

## 2010-09-27 LAB — TSH: TSH: 1.951 u[IU]/mL (ref 0.350–4.500)

## 2010-09-27 NOTE — Progress Notes (Signed)
HPI The patient presents for evaluation of palpitations. She has noticed about 3 episodes of this in the last 3 weeks. They occur at rest. She describes a rapid heart rate. She does not describe an irregular beat. It may last for about 2 minutes. It comes on at rest and goes away spontaneously. She does describe some shortness of breath. She does not describe associated chest pressure, neck or arm discomfort. She hasn't had any presyncope or syncope. She never had this before. She does get some sharp upper chest discomfort with exercise though she admits to not doing this for a period she has also had some shortness of breath and weakness with activity such as vacuuming. She is [redacted] weeks pregnant. She has had 2 other normal pregnancies. She otherwise has had an uncomplicated pregnancy. Is not having any PND or orthopnea. She is had appropriate weight gain and no edema.  No Known Allergies  Current Outpatient Prescriptions  Medication Sig Dispense Refill  . acetaminophen (TYLENOL) 325 MG tablet Take 650 mg by mouth every 6 (six) hours as needed. For pain and aches       . prenatal vitamin w/FE, FA (PRENATAL 1 + 1) 27-1 MG TABS Take 1 tablet by mouth daily.          Past Medical History  Diagnosis Date  . History of infertility, female     Unexplained  . Tachycardia   . Neurological disease     Venous angioma    Past Surgical History  Procedure Date  . No past surgeries   . Laparoscopy     Intertility    ROS:  Headaches.  Reflux. Otherwise as stated in the HPI and negative for all other systems.  PHYSICAL EXAM BP 114/75  Pulse 94  Resp 18  Ht 5\' 3"  (1.6 m)  Wt 164 lb (74.39 kg)  BMI 29.05 kg/m2  LMP 03/20/2010 GENERAL:  Well appearing HEENT:  Pupils equal round and reactive, fundi not visualized, oral mucosa unremarkable NECK:  No jugular venous distention, waveform within normal limits, carotid upstroke brisk and symmetric, no bruits, no thyromegaly LYMPHATICS:  No cervical,  inguinal adenopathy LUNGS:  Clear to auscultation bilaterally BACK:  No CVA tenderness CHEST:  Unremarkable HEART:  PMI not displaced or sustained,S1 and S2 within normal limits, no S3, no S4, no clicks, no rubs, soft right mid sternal short systolic murmur ABD:  Flat, positive bowel sounds normal in frequency in pitch, no bruits, no rebound, no guarding, no midline pulsatile mass, no hepatomegaly, no splenomegaly, gravid EXT:  2 plus pulses throughout, no edema, no cyanosis no clubbing SKIN:  No rashes no nodules NEURO:  Cranial nerves II through XII grossly intact, motor grossly intact throughout PSYCH:  Cognitively intact, oriented to person place and time   EKG:  Sinus rhythm, rate 97, axis within normal limits, intervals within normal limits, no acute ST-T wave changes.   ASSESSMENT AND PLAN

## 2010-09-27 NOTE — Assessment & Plan Note (Signed)
I suspect a flow murmur.  However, given the dyspnea and chest pain echocardiography is indicated.

## 2010-09-27 NOTE — Assessment & Plan Note (Signed)
I will check with the referring office to see if there has been a recent TSH.  She will wear a 21 day event monitor.

## 2010-09-27 NOTE — Patient Instructions (Signed)
Your physician has recommended that you wear an event monitor. Event monitors are medical devices that record the heart's electrical activity. Doctors most often [place] these monitors to diagnose arrhythmias. Arrhythmias are problems with the speed or rhythm of the heartbeat. The monitor is a small, portable device. You can wear one while you do your normal daily activities. This is usually used to diagnose what is causing palpitations/syncope (passing out).  Your physician has requested that you have an echocardiogram. Echocardiography is a painless test that uses sound waves to create images of your heart. It provides your doctor with information about the size and shape of your heart and how well your heart's chambers and valves are working. This procedure takes approximately one hour. There are no restrictions for this procedure.  Your physician recommends that you return for lab work in: today. Your physician recommends that you schedule a follow-up appointment in: 6 weeks with Tereso Newcomer, PA

## 2010-09-27 NOTE — Assessment & Plan Note (Signed)
This is atypical.  Further evaluation will be based on the results of the above testing and symptoms if they persist.

## 2010-09-30 LAB — CBC
MCHC: 35.2
MCV: 88.7
Platelets: 223
Platelets: 245
RBC: 3.64 — ABNORMAL LOW
RDW: 12.5
WBC: 13.9 — ABNORMAL HIGH

## 2010-09-30 LAB — RPR: RPR Ser Ql: NONREACTIVE

## 2010-10-03 ENCOUNTER — Ambulatory Visit (HOSPITAL_COMMUNITY): Payer: Medicaid Other | Attending: Cardiology | Admitting: Radiology

## 2010-10-03 ENCOUNTER — Encounter (INDEPENDENT_AMBULATORY_CARE_PROVIDER_SITE_OTHER): Payer: Medicaid Other

## 2010-10-03 DIAGNOSIS — R0602 Shortness of breath: Secondary | ICD-10-CM

## 2010-10-03 DIAGNOSIS — I2510 Atherosclerotic heart disease of native coronary artery without angina pectoris: Secondary | ICD-10-CM | POA: Insufficient documentation

## 2010-10-03 DIAGNOSIS — R002 Palpitations: Secondary | ICD-10-CM | POA: Insufficient documentation

## 2010-10-03 DIAGNOSIS — R079 Chest pain, unspecified: Secondary | ICD-10-CM | POA: Insufficient documentation

## 2010-10-03 DIAGNOSIS — O99419 Diseases of the circulatory system complicating pregnancy, unspecified trimester: Secondary | ICD-10-CM | POA: Insufficient documentation

## 2010-10-03 DIAGNOSIS — I079 Rheumatic tricuspid valve disease, unspecified: Secondary | ICD-10-CM | POA: Insufficient documentation

## 2010-10-03 DIAGNOSIS — R0789 Other chest pain: Secondary | ICD-10-CM

## 2010-10-03 DIAGNOSIS — O9989 Other specified diseases and conditions complicating pregnancy, childbirth and the puerperium: Secondary | ICD-10-CM | POA: Insufficient documentation

## 2010-10-03 DIAGNOSIS — I059 Rheumatic mitral valve disease, unspecified: Secondary | ICD-10-CM | POA: Insufficient documentation

## 2010-10-11 ENCOUNTER — Encounter: Payer: Self-pay | Admitting: Cardiology

## 2010-10-18 LAB — URINALYSIS, ROUTINE W REFLEX MICROSCOPIC
Glucose, UA: NEGATIVE
Ketones, ur: NEGATIVE
Nitrite: NEGATIVE
Protein, ur: NEGATIVE
pH: 7

## 2010-10-18 LAB — WET PREP, GENITAL
Clue Cells Wet Prep HPF POC: NONE SEEN
Yeast Wet Prep HPF POC: NONE SEEN

## 2010-10-18 LAB — URINE MICROSCOPIC-ADD ON

## 2010-10-18 LAB — GC/CHLAMYDIA PROBE AMP, GENITAL: Chlamydia, DNA Probe: NEGATIVE

## 2010-10-18 LAB — POCT PREGNANCY, URINE: Preg Test, Ur: POSITIVE

## 2010-11-04 ENCOUNTER — Ambulatory Visit (INDEPENDENT_AMBULATORY_CARE_PROVIDER_SITE_OTHER): Payer: Medicaid Other | Admitting: Physician Assistant

## 2010-11-04 ENCOUNTER — Encounter: Payer: Self-pay | Admitting: Physician Assistant

## 2010-11-04 DIAGNOSIS — R002 Palpitations: Secondary | ICD-10-CM

## 2010-11-04 DIAGNOSIS — R0989 Other specified symptoms and signs involving the circulatory and respiratory systems: Secondary | ICD-10-CM | POA: Insufficient documentation

## 2010-11-04 DIAGNOSIS — R0602 Shortness of breath: Secondary | ICD-10-CM

## 2010-11-04 DIAGNOSIS — R011 Cardiac murmur, unspecified: Secondary | ICD-10-CM

## 2010-11-04 NOTE — Assessment & Plan Note (Signed)
Flow murmur from pregnancy.

## 2010-11-04 NOTE — Assessment & Plan Note (Signed)
I suspect I am just hearing her flow murmur.  However, she can hear her heart beat and she does have a family hx of vascular disease.  She is a prior smoker. Check carotid dopplers.

## 2010-11-04 NOTE — Assessment & Plan Note (Signed)
Again, likely related to her pregnancy.  Echo was normal.  No further cardiac testing.

## 2010-11-04 NOTE — Patient Instructions (Signed)
Your physician recommends that you schedule a follow-up appointment in: as needed  Your physician has requested that you have a carotid duplex. This test is an ultrasound of the carotid arteries in your neck. It looks at blood flow through these arteries that supply the brain with blood. Allow one hour for this exam. There are no restrictions or special instructions.

## 2010-11-04 NOTE — Progress Notes (Signed)
History of Present Illness: Primary Cardiologist:  Dr. Rollene Rotunda  Kathleen Cooper is a 30 y.o. female who presents for follow up.  She saw Dr. Antoine Poche last month for evaluation of palpitations, dyspnea and chest pain.  She is [redacted] weeks pregnant.  She had a murmur on exam.  Echo 10/03/10: EF 55-60%, normal LV thickness and normal diast function.  TSH was obtained and this was normal.  Event monitor demonstrated NSR and sinus tachy.  No arrhythmias were noted.  Chest pain was atypical and no ischemic testing was felt to be warranted at the time of her initial visit.  Overall, she is doing well.  She denies chest pain.  She feels her heart rate increase when she is more active. She denies syncope.  No orthopnea or PND.  No significant edema.  She recently started coughing.  No fever.  No production.  She sees her OB this week and will discuss treatment with for URI with her. (Dr. Senaida Ores).  She states she hears her heart beat in her left ear.  We discussed the results of all her tests.  Past Medical History  Diagnosis Date  . History of infertility, female     Unexplained  . Tachycardia   . Neurological disease     Venous angioma    Current Outpatient Prescriptions  Medication Sig Dispense Refill  . acetaminophen (TYLENOL) 325 MG tablet Take 650 mg by mouth every 6 (six) hours as needed. For pain and aches       . prenatal vitamin w/FE, FA (PRENATAL 1 + 1) 27-1 MG TABS Take 1 tablet by mouth daily.          Allergies: No Known Allergies   History  Substance Use Topics  . Smoking status: Former Games developer  . Smokeless tobacco: Not on file  . Alcohol Use: No     Vital Signs: BP 112/68  Pulse 80  Ht 5\' 3"  (1.6 m)  Wt 175 lb 6.4 oz (79.561 kg)  BMI 31.07 kg/m2  LMP 03/20/2010  PHYSICAL EXAM: Well nourished, well developed, in no acute distress HEENT: normal Neck: no JVD Vascular: ? Faint bruit over left carotid Cardiac:  normal S1, S2; RRR; 1/6 systolic murmur along LSB Lungs:   clear to auscultation bilaterally, no wheezing, rhonchi or rales Abd: [redacted] weeks gestation Ext: no edema Skin: warm and dry Neuro:  CNs 2-12 intact, no focal abnormalities noted  EKG:  NSR, HR 83, PRWP, no ischemic changes  ASSESSMENT AND PLAN:

## 2010-11-04 NOTE — Assessment & Plan Note (Signed)
I suspect this is functional sinus tachy from her pregnancy.  The event monitor and the echo were both unremarkable.  We discussed this in detail.  No further testing for now.  She can follow up PRN.

## 2010-11-27 ENCOUNTER — Encounter (INDEPENDENT_AMBULATORY_CARE_PROVIDER_SITE_OTHER): Payer: Medicaid Other | Admitting: Cardiology

## 2010-11-27 DIAGNOSIS — R0989 Other specified symptoms and signs involving the circulatory and respiratory systems: Secondary | ICD-10-CM

## 2010-12-05 ENCOUNTER — Encounter (HOSPITAL_COMMUNITY): Payer: Self-pay | Admitting: Pharmacist

## 2010-12-10 ENCOUNTER — Encounter (HOSPITAL_COMMUNITY): Payer: Self-pay

## 2010-12-10 ENCOUNTER — Encounter (HOSPITAL_COMMUNITY)
Admission: RE | Admit: 2010-12-10 | Discharge: 2010-12-10 | Disposition: A | Discharge status: Home / Self Care | Payer: Medicaid Other | Source: Ambulatory Visit | Attending: Obstetrics and Gynecology | Admitting: Obstetrics and Gynecology

## 2010-12-10 LAB — RPR: RPR Ser Ql: NONREACTIVE

## 2010-12-10 LAB — CBC
MCH: 30 pg (ref 26.0–34.0)
MCHC: 33.7 g/dL (ref 30.0–36.0)
Platelets: 206 10*3/uL (ref 150–400)

## 2010-12-10 LAB — SURGICAL PCR SCREEN
MRSA, PCR: NEGATIVE
Staphylococcus aureus: NEGATIVE

## 2010-12-10 NOTE — Patient Instructions (Signed)
YOUR PROCEDURE IS SCHEDULED ON:12/19/10  ENTER THROUGH THE MAIN ENTRANCE OF [facility] AT:6am  USE DESK PHONE AND DIAL [id number] TO INFORM us OF YOUR ARRIVAL  CALL [phone] IF YOU HAVE ANY QUESTIONS OR PROBLEMS PRIOR TO YOUR ARRIVAL.  REMEMBER: DO NOT EAT OR DRINK AFTER MIDNIGHT :Wed  SPECIAL INSTRUCTIONS:shower with Hibiclens   YOU MAY BRUSH YOUR TEETH THE MORNING OF SURGERY   TAKE THESE MEDICINES THE DAY OF SURGERY WITH SIP OF WATER:none   DO NOT WEAR JEWELRY, EYE MAKEUP, LIPSTICK OR DARK FINGERNAIL POLISH DO NOT WEAR LOTIONS  DO NOT SHAVE FOR 48 HOURS PRIOR TO SURGERY  YOU WILL NOT BE ALLOWED TO DRIVE YOURSELF HOME.  NAME OF DRIVER:Ahmed

## 2010-12-10 NOTE — Pre-Procedure Instructions (Signed)
History of palpitations- neg cardiac work up per pt and notes in prenatal records.

## 2010-12-18 MED ORDER — CEFAZOLIN SODIUM-DEXTROSE 2-3 GM-% IV SOLR
2.0000 g | INTRAVENOUS | Status: DC
  Filled 2010-12-18: qty 50

## 2010-12-18 NOTE — H&P (Addendum)
Kathleen Cooper is a 30 y.o. female G3P2002 at 39+ weeks (EDD 12/25/10 by LMP c/w 9 week [place]) presenting for scheduled C/S due to an AVM diagnosed by Dr. Vickey Huger in 2010.  Dr. Vickey Huger does not advise any labor or pushing, and recommends C/S for delivery.  Pt otherwise had some palpitations with her pregnancy but cardiology w/u showed a normal ECHO and EKG.  Carotid dopplers showed no obstruction but possible thickened walls, so pt is to f/u with an angiogram postpartum.  Pt  And FOB also are first cousins--she declined genetic counseling.   History OB History    Grav Para Term Preterm Abortions TAB SAB Ect Mult Living   3 2 2  0 0 0 0 0 0 2    NSVD 2002 7#12oz NSVD 2009 7#6oz  Past Medical History  Diagnosis Date  . History of infertility, female     Unexplained  . Tachycardia   . Neurological disease     Venous angioma   Past Surgical History  Procedure Date  . Laparoscopy     Intertility   Family History: family history includes Coronary artery disease (age of onset:63) in her father; Diabetes in her father; and Hyperlipidemia (age of onset:65) in her mother. Social History:  reports that she has quit smoking. She does not have any smokeless tobacco history on file. She reports that she does not drink alcohol or use illicit drugs.  ROS    Last menstrual period 03/20/2010. Maternal Exam:  Uterine Assessment: Contraction strength is mild.  Contraction frequency is rare.   Abdomen: Estimated fetal weight is 7 lbs.   Fetal presentation: vertex  Introitus: Normal vulva. Normal vagina.    Physical Exam  Constitutional: She is oriented to person, place, and time. She appears well-developed and well-nourished.  Cardiovascular: Normal rate and regular rhythm.   Respiratory: Effort normal and breath sounds normal.  GI: Soft. Bowel sounds are normal.  Genitourinary: Vagina normal and uterus normal.       Cervix 50/2/-2  Neurological: She is alert and oriented to person, place, and  time.  Psychiatric: She has a normal mood and affect. Her behavior is normal.    Prenatal labs: ABO, Rh: A/Positive/-- (05/24 0000) Antibody: Negative (05/24 0000) Rubella: Immune (05/24 0000) RPR: NON REACTIVE (12/04 0913)  HBsAg: Negative (05/24 0000)  HIV: Non-reactive (05/24 0000)  GBS:   negative One hour glucola WNL Integrated screen WNL  Assessment/Plan: Pt for scheduled C/S given a venous angioma in frontal lobe that neurology feels is unsafe for vaginal delivery.  Risks of C/S d/w pt in detail, including bleeding infection and damage to bowel and bladder.  She understands and is ready to proceed.  She declines BTL.   Oliver Pila 12/18/2010, 10:40 PM    Per pt no changes in H&P, brief exam is WNL.

## 2010-12-19 ENCOUNTER — Encounter (HOSPITAL_COMMUNITY): Payer: Self-pay | Admitting: Anesthesiology

## 2010-12-19 ENCOUNTER — Encounter (HOSPITAL_COMMUNITY): Payer: Self-pay | Admitting: *Deleted

## 2010-12-19 ENCOUNTER — Inpatient Hospital Stay (HOSPITAL_COMMUNITY)
Admission: RE | Admit: 2010-12-19 | Discharge: 2010-12-22 | DRG: 766 | Disposition: A | Discharge status: Home / Self Care | Payer: Medicaid Other | Source: Ambulatory Visit | Attending: Obstetrics and Gynecology | Admitting: Obstetrics and Gynecology

## 2010-12-19 ENCOUNTER — Encounter (HOSPITAL_COMMUNITY)
Admission: RE | Disposition: A | Discharge status: Home / Self Care | Payer: Self-pay | Source: Ambulatory Visit | Attending: Obstetrics and Gynecology

## 2010-12-19 ENCOUNTER — Other Ambulatory Visit: Payer: Self-pay | Admitting: Obstetrics and Gynecology

## 2010-12-19 ENCOUNTER — Inpatient Hospital Stay (HOSPITAL_COMMUNITY): Payer: Medicaid Other | Admitting: Anesthesiology

## 2010-12-19 DIAGNOSIS — O99892 Other specified diseases and conditions complicating childbirth: Principal | ICD-10-CM | POA: Diagnosis present

## 2010-12-19 LAB — TYPE AND SCREEN: ABO/RH(D): A POS

## 2010-12-19 SURGERY — Surgical Case
Anesthesia: Spinal | Site: Abdomen | Wound class: Clean Contaminated

## 2010-12-19 MED ORDER — ACETAMINOPHEN 325 MG PO TABS: 325.0000 mg | ORAL_TABLET | ORAL | Status: DC | PRN

## 2010-12-19 MED ORDER — KETOROLAC TROMETHAMINE 30 MG/ML IJ SOLN
30.0000 mg | Freq: Four times a day (QID) | INTRAMUSCULAR | Status: AC | PRN
  Administered 2010-12-19: 30 mg via INTRAVENOUS

## 2010-12-19 MED ORDER — OXYTOCIN 20 UNITS IN LACTATED RINGERS INFUSION - SIMPLE
INTRAVENOUS | Status: DC | PRN
  Administered 2010-12-19: 20 [IU] via INTRAVENOUS

## 2010-12-19 MED ORDER — TETANUS-DIPHTH-ACELL PERTUSSIS 5-2.5-18.5 LF-MCG/0.5 IM SUSP
0.5000 mL | Freq: Once | INTRAMUSCULAR | Status: AC
  Administered 2010-12-21: 0.5 mL via INTRAMUSCULAR
  Filled 2010-12-19: qty 0.5

## 2010-12-19 MED ORDER — WITCH HAZEL-GLYCERIN EX PADS: 1.0000 "application " | MEDICATED_PAD | CUTANEOUS | Status: DC | PRN

## 2010-12-19 MED ORDER — DIPHENHYDRAMINE HCL 25 MG PO CAPS
25.0000 mg | ORAL_CAPSULE | ORAL | Status: DC | PRN
  Administered 2010-12-20: 25 mg via ORAL
  Filled 2010-12-19 (×2): qty 1

## 2010-12-19 MED ORDER — IBUPROFEN 600 MG PO TABS
600.0000 mg | ORAL_TABLET | Freq: Four times a day (QID) | ORAL | Status: DC | PRN
  Filled 2010-12-19 (×2): qty 1

## 2010-12-19 MED ORDER — LACTATED RINGERS IV SOLN
INTRAVENOUS | Status: DC
  Administered 2010-12-19: 18:00:00 via INTRAVENOUS

## 2010-12-19 MED ORDER — SENNOSIDES-DOCUSATE SODIUM 8.6-50 MG PO TABS
2.0000 | ORAL_TABLET | Freq: Every day | ORAL | Status: DC
  Administered 2010-12-19 – 2010-12-21 (×3): 2 via ORAL

## 2010-12-19 MED ORDER — ZOLPIDEM TARTRATE 5 MG PO TABS: 5.0000 mg | ORAL_TABLET | Freq: Every evening | ORAL | Status: DC | PRN

## 2010-12-19 MED ORDER — MENTHOL 3 MG MT LOZG: 1.0000 | LOZENGE | OROMUCOSAL | Status: DC | PRN

## 2010-12-19 MED ORDER — ONDANSETRON HCL 4 MG/2ML IJ SOLN
INTRAMUSCULAR | Status: AC
  Filled 2010-12-19: qty 2

## 2010-12-19 MED ORDER — CEFAZOLIN SODIUM 1-5 GM-% IV SOLN
INTRAVENOUS | Status: DC | PRN
  Administered 2010-12-19: 2 g via INTRAVENOUS

## 2010-12-19 MED ORDER — PROMETHAZINE HCL 25 MG/ML IJ SOLN: 6.2500 mg | INTRAMUSCULAR | Status: DC | PRN

## 2010-12-19 MED ORDER — METOCLOPRAMIDE HCL 5 MG/ML IJ SOLN
INTRAMUSCULAR | Status: AC
  Administered 2010-12-19: 10 mg via INTRAVENOUS
  Filled 2010-12-19: qty 2

## 2010-12-19 MED ORDER — SCOPOLAMINE 1 MG/3DAYS TD PT72
MEDICATED_PATCH | TRANSDERMAL | Status: AC
  Administered 2010-12-19: 1.5 mg
  Filled 2010-12-19: qty 1

## 2010-12-19 MED ORDER — MEPERIDINE HCL 25 MG/ML IJ SOLN: 6.2500 mg | INTRAMUSCULAR | Status: DC | PRN

## 2010-12-19 MED ORDER — ONDANSETRON HCL 4 MG/2ML IJ SOLN: 4.0000 mg | INTRAMUSCULAR | Status: DC | PRN

## 2010-12-19 MED ORDER — FENTANYL CITRATE 0.05 MG/ML IJ SOLN
INTRAMUSCULAR | Status: DC | PRN
  Administered 2010-12-19: 25 ug via INTRATHECAL

## 2010-12-19 MED ORDER — LACTATED RINGERS IV SOLN
INTRAVENOUS | Status: DC | PRN
  Administered 2010-12-19 (×3): via INTRAVENOUS

## 2010-12-19 MED ORDER — PHENYLEPHRINE HCL 10 MG/ML IJ SOLN
INTRAMUSCULAR | Status: DC | PRN
  Administered 2010-12-19 (×4): 40 ug via INTRAVENOUS
  Administered 2010-12-19 (×2): 80 ug via INTRAVENOUS
  Administered 2010-12-19: 40 ug via INTRAVENOUS
  Administered 2010-12-19: 80 ug via INTRAVENOUS
  Administered 2010-12-19 (×3): 40 ug via INTRAVENOUS

## 2010-12-19 MED ORDER — MORPHINE SULFATE (PF) 0.5 MG/ML IJ SOLN
INTRAMUSCULAR | Status: DC | PRN
  Administered 2010-12-19: .15 mg via INTRATHECAL

## 2010-12-19 MED ORDER — FENTANYL CITRATE 0.05 MG/ML IJ SOLN
INTRAMUSCULAR | Status: AC
  Filled 2010-12-19: qty 2

## 2010-12-19 MED ORDER — SODIUM CHLORIDE 0.9 % IV SOLN
1.0000 ug/kg/h | INTRAVENOUS | Status: DC | PRN
  Filled 2010-12-19: qty 2.5

## 2010-12-19 MED ORDER — ONDANSETRON HCL 4 MG/2ML IJ SOLN: 4.0000 mg | Freq: Three times a day (TID) | INTRAMUSCULAR | Status: DC | PRN

## 2010-12-19 MED ORDER — NALBUPHINE HCL 10 MG/ML IJ SOLN
5.0000 mg | INTRAMUSCULAR | Status: DC | PRN
  Administered 2010-12-19: 5 mg via INTRAVENOUS
  Filled 2010-12-19: qty 1

## 2010-12-19 MED ORDER — OXYCODONE-ACETAMINOPHEN 5-325 MG PO TABS
1.0000 | ORAL_TABLET | ORAL | Status: DC | PRN
  Administered 2010-12-20 – 2010-12-22 (×5): 1 via ORAL
  Filled 2010-12-19 (×5): qty 1

## 2010-12-19 MED ORDER — PRENATAL PLUS 27-1 MG PO TABS
1.0000 | ORAL_TABLET | Freq: Every day | ORAL | Status: DC
  Administered 2010-12-20 – 2010-12-22 (×3): 1 via ORAL
  Filled 2010-12-19 (×3): qty 1

## 2010-12-19 MED ORDER — KETOROLAC TROMETHAMINE 30 MG/ML IJ SOLN: 30.0000 mg | Freq: Four times a day (QID) | INTRAMUSCULAR | Status: AC | PRN

## 2010-12-19 MED ORDER — DIBUCAINE 1 % RE OINT: 1.0000 "application " | TOPICAL_OINTMENT | RECTAL | Status: DC | PRN

## 2010-12-19 MED ORDER — FENTANYL CITRATE 0.05 MG/ML IJ SOLN
INTRAMUSCULAR | Status: AC
  Administered 2010-12-19: 50 ug via INTRAVENOUS
  Filled 2010-12-19: qty 2

## 2010-12-19 MED ORDER — KETOROLAC TROMETHAMINE 30 MG/ML IJ SOLN
INTRAMUSCULAR | Status: AC
  Filled 2010-12-19: qty 1

## 2010-12-19 MED ORDER — FENTANYL CITRATE 0.05 MG/ML IJ SOLN
25.0000 ug | INTRAMUSCULAR | Status: DC | PRN
  Administered 2010-12-19: 50 ug via INTRAVENOUS

## 2010-12-19 MED ORDER — OXYTOCIN 10 UNIT/ML IJ SOLN
INTRAMUSCULAR | Status: AC
  Filled 2010-12-19: qty 2

## 2010-12-19 MED ORDER — ACETAMINOPHEN 10 MG/ML IV SOLN
1000.0000 mg | Freq: Four times a day (QID) | INTRAVENOUS | Status: AC | PRN
  Filled 2010-12-19: qty 100

## 2010-12-19 MED ORDER — MORPHINE SULFATE 0.5 MG/ML IJ SOLN
INTRAMUSCULAR | Status: AC
  Filled 2010-12-19: qty 10

## 2010-12-19 MED ORDER — IBUPROFEN 600 MG PO TABS
600.0000 mg | ORAL_TABLET | Freq: Four times a day (QID) | ORAL | Status: DC
  Administered 2010-12-19 – 2010-12-22 (×11): 600 mg via ORAL
  Filled 2010-12-19 (×9): qty 1

## 2010-12-19 MED ORDER — DIPHENHYDRAMINE HCL 50 MG/ML IJ SOLN: 12.5000 mg | INTRAMUSCULAR | Status: DC | PRN

## 2010-12-19 MED ORDER — SCOPOLAMINE 1 MG/3DAYS TD PT72: 1.0000 | MEDICATED_PATCH | Freq: Once | TRANSDERMAL | Status: DC

## 2010-12-19 MED ORDER — NALOXONE HCL 0.4 MG/ML IJ SOLN: 0.4000 mg | INTRAMUSCULAR | Status: DC | PRN

## 2010-12-19 MED ORDER — ONDANSETRON HCL 4 MG PO TABS: 4.0000 mg | ORAL_TABLET | ORAL | Status: DC | PRN

## 2010-12-19 MED ORDER — SIMETHICONE 80 MG PO CHEW: 80.0000 mg | CHEWABLE_TABLET | ORAL | Status: DC | PRN

## 2010-12-19 MED ORDER — INFLUENZA VIRUS VACC SPLIT PF IM SUSP
0.5000 mL | INTRAMUSCULAR | Status: AC
  Administered 2010-12-21: 0.5 mL via INTRAMUSCULAR
  Filled 2010-12-19: qty 0.5

## 2010-12-19 MED ORDER — NALBUPHINE HCL 10 MG/ML IJ SOLN
5.0000 mg | INTRAMUSCULAR | Status: DC | PRN
  Administered 2010-12-19: 5 mg via SUBCUTANEOUS
  Filled 2010-12-19: qty 1

## 2010-12-19 MED ORDER — DIPHENHYDRAMINE HCL 50 MG/ML IJ SOLN: 25.0000 mg | INTRAMUSCULAR | Status: DC | PRN

## 2010-12-19 MED ORDER — SIMETHICONE 80 MG PO CHEW
80.0000 mg | CHEWABLE_TABLET | Freq: Three times a day (TID) | ORAL | Status: DC
  Administered 2010-12-19 – 2010-12-22 (×11): 80 mg via ORAL

## 2010-12-19 MED ORDER — OXYTOCIN 20 UNITS IN LACTATED RINGERS INFUSION - SIMPLE: 125.0000 mL/h | INTRAVENOUS | Status: AC

## 2010-12-19 MED ORDER — LACTATED RINGERS IV SOLN
INTRAVENOUS | Status: DC
  Administered 2010-12-19 (×2): via INTRAVENOUS

## 2010-12-19 MED ORDER — DIPHENHYDRAMINE HCL 25 MG PO CAPS
25.0000 mg | ORAL_CAPSULE | Freq: Four times a day (QID) | ORAL | Status: DC | PRN
  Administered 2010-12-19: 25 mg via ORAL
  Filled 2010-12-19: qty 1

## 2010-12-19 MED ORDER — LANOLIN HYDROUS EX OINT: 1.0000 "application " | TOPICAL_OINTMENT | CUTANEOUS | Status: DC | PRN

## 2010-12-19 MED ORDER — METOCLOPRAMIDE HCL 5 MG/ML IJ SOLN
10.0000 mg | Freq: Three times a day (TID) | INTRAMUSCULAR | Status: DC | PRN
  Administered 2010-12-19: 10 mg via INTRAVENOUS

## 2010-12-19 MED ORDER — SODIUM CHLORIDE 0.9 % IJ SOLN: 3.0000 mL | INTRAMUSCULAR | Status: DC | PRN

## 2010-12-19 MED ORDER — PHENYLEPHRINE 40 MCG/ML (10ML) SYRINGE FOR IV PUSH (FOR BLOOD PRESSURE SUPPORT)
PREFILLED_SYRINGE | INTRAVENOUS | Status: AC
  Filled 2010-12-19: qty 15

## 2010-12-19 SURGICAL SUPPLY — 32 items
APL SKNCLS STERI-STRIP NONHPOA (GAUZE/BANDAGES/DRESSINGS) ×1
BENZOIN TINCTURE PRP APPL 2/3 (GAUZE/BANDAGES/DRESSINGS) ×1 IMPLANT
CHLORAPREP W/TINT 26ML (MISCELLANEOUS) ×2 IMPLANT
CLOSURE STERI STRIP 1/2 X4 (GAUZE/BANDAGES/DRESSINGS) ×2 IMPLANT
CLOTH BEACON ORANGE TIMEOUT ST (SAFETY) ×2 IMPLANT
CONTAINER PREFILL 10% NBF 15ML (MISCELLANEOUS) IMPLANT
ELECT REM PT RETURN 9FT ADLT (ELECTROSURGICAL) ×2
ELECTRODE REM PT RTRN 9FT ADLT (ELECTROSURGICAL) ×1 IMPLANT
EXTRACTOR VACUUM KIWI (MISCELLANEOUS) ×1 IMPLANT
EXTRACTOR VACUUM M CUP 4 TUBE (SUCTIONS) IMPLANT
GLOVE BIO SURGEON STRL SZ 6.5 (GLOVE) ×4 IMPLANT
GLOVE BIO SURGEON STRL SZ8 (GLOVE) ×2 IMPLANT
GOWN PREVENTION PLUS LG XLONG (DISPOSABLE) ×4 IMPLANT
KIT ABG SYR 3ML LUER SLIP (SYRINGE) IMPLANT
NDL HYPO 25X5/8 SAFETYGLIDE (NEEDLE) ×1 IMPLANT
NEEDLE HYPO 25X5/8 SAFETYGLIDE (NEEDLE) ×2 IMPLANT
NS IRRIG 1000ML POUR BTL (IV SOLUTION) ×2 IMPLANT
PACK C SECTION WH (CUSTOM PROCEDURE TRAY) ×2 IMPLANT
RTRCTR C-SECT PINK 25CM LRG (MISCELLANEOUS) ×2 IMPLANT
SLEEVE SCD COMPRESS KNEE MED (MISCELLANEOUS) IMPLANT
STAPLER VISISTAT 35W (STAPLE) IMPLANT
SUT CHROMIC 1 CTX 36 (SUTURE) ×4 IMPLANT
SUT PLAIN 0 NONE (SUTURE) IMPLANT
SUT PLAIN 2 0 XLH (SUTURE) IMPLANT
SUT VIC AB 0 CT1 27 (SUTURE) ×4
SUT VIC AB 0 CT1 27XBRD ANBCTR (SUTURE) ×2 IMPLANT
SUT VIC AB 2-0 CT1 27 (SUTURE) ×2
SUT VIC AB 2-0 CT1 TAPERPNT 27 (SUTURE) IMPLANT
SUT VIC AB 4-0 KS 27 (SUTURE) ×2 IMPLANT
TOWEL OR 17X24 6PK STRL BLUE (TOWEL DISPOSABLE) ×4 IMPLANT
TRAY FOLEY CATH 14FR (SET/KITS/TRAYS/PACK) ×2 IMPLANT
WATER STERILE IRR 1000ML POUR (IV SOLUTION) ×2 IMPLANT

## 2010-12-19 NOTE — Anesthesia Procedure Notes (Addendum)

## 2010-12-19 NOTE — Brief Op Note (Signed)
12/19/2010  8:31 AM  PATIENT:  Kathleen Cooper  30 y.o. female  PRE-OPERATIVE DIAGNOSIS:   Term pregnancy 39 weeks                                                        History of veneous malformation contraindicating labor  POST-OPERATIVE DIAGNOSIS:  same  PROCEDURE:  Procedure(s): PRIMARY LOW TRANSVERSE CESAREAN SECTION with 2 layer closure of uterus  SURGEON:  Surgeon(s): Angelica Pou, MD  ANESTHESIA:   spinal  EBL:  Total I/O In: 2000 [I.V.:2000] Out: 650 [Urine:150; Blood:500]  BLOOD ADMINISTERED:none  DRAINS: Urinary Catheter (Foley)   SPECIMEN:  Placenta DISPOSITION OF SPECIMEN:  PATHOLOGY  COUNTS:  YES  DICTATION: .Dragon Dictation  PLAN OF CARE: Admit to inpatient   PATIENT DISPOSITION:  PACU - hemodynamically stable.

## 2010-12-19 NOTE — Anesthesia Postprocedure Evaluation (Signed)
  Anesthesia Post-op Note  Patient: Kathleen Cooper  Procedure(s) Performed:  CESAREAN SECTION - Primary cesarean section.   Patient Location: Mother/Baby  Anesthesia Type: Spinal  Level of Consciousness: awake, alert  and oriented  Airway and Oxygen Therapy: Patient Spontanous Breathing  Post-op Pain: none  Post-op Assessment: Post-op Vital signs reviewed and Patient's Cardiovascular Status Stable  Post-op Vital Signs: Reviewed and stable  Complications: No apparent anesthesia complications

## 2010-12-19 NOTE — Anesthesia Preprocedure Evaluation (Addendum)
Anesthesia Evaluation  Patient identified by MRN, date of birth, ID band Patient awake    Reviewed: Allergy & Precautions, H&P , NPO status , Patient's Chart, lab work & pertinent test results  Airway Mallampati: II      Dental No notable dental hx.    Pulmonary neg pulmonary ROS, shortness of breath,  clear to auscultation  Pulmonary exam normal       Cardiovascular Exercise Tolerance: Good neg cardio ROS + Valvular Problems/Murmurs regular Normal Carotid buit... Work up at [employer] Negative Neurological ROS  Negative Psych ROS   GI/Hepatic negative GI ROS, Neg liver ROS,   Endo/Other  Negative Endocrine ROS  Renal/GU negative Renal ROS  Genitourinary negative   Musculoskeletal   Abdominal Normal abdominal exam  (+)   Peds  Hematology negative hematology ROS (+)   Anesthesia Other Findings Venous angioma- CNS Carotid bruits- no obstruction  Reproductive/Obstetrics (+) Pregnancy                         Anesthesia Physical Anesthesia Plan  ASA: II  Anesthesia Plan: Spinal   Post-op Pain Management:    Induction:   Airway Management Planned:   Additional Equipment:   Intra-op Plan:   Post-operative Plan:   Informed Consent: I have reviewed the patients History and Physical, chart, labs and discussed the procedure including the risks, benefits and alternatives for the proposed anesthesia with the patient or authorized representative who has indicated his/her understanding and acceptance.     Plan Discussed with: Anesthesiologist, CRNA and Surgeon  Anesthesia Plan Comments:         Anesthesia Quick Evaluation

## 2010-12-19 NOTE — Transfer of Care (Signed)
Immediate Anesthesia Transfer of Care Note  Patient: Kathleen Cooper  Procedure(s) Performed:  CESAREAN SECTION - Primary cesarean section.   Patient Location: PACU  Anesthesia Type: Spinal  Level of Consciousness: awake, alert  and oriented  Airway & Oxygen Therapy: Patient Spontanous Breathing  Post-op Assessment: Report given to PACU RN and Post -op Vital signs reviewed and stable  Post vital signs: Reviewed and stable  Complications: No apparent anesthesia complications

## 2010-12-19 NOTE — Addendum Note (Signed)
Addendum  created 12/19/10 1521 by Madison Hickman   Modules edited:Notes Section

## 2010-12-19 NOTE — Anesthesia Postprocedure Evaluation (Signed)
  Anesthesia Post-op Note  Patient: Kathleen Cooper  Procedure(s) Performed:  CESAREAN SECTION - Primary cesarean section.   Patient Location: PACU  Anesthesia Type: Spinal  Level of Consciousness: awake, alert  and oriented  Airway and Oxygen Therapy: Patient Spontanous Breathing  Post-op Pain: none  Post-op Assessment: Post-op Vital signs reviewed, Patient's Cardiovascular Status Stable, Respiratory Function Stable, Patent Airway, No signs of Nausea or vomiting, Pain level controlled, No headache and No backache  Post-op Vital Signs: Reviewed and stable  Complications: No apparent anesthesia complications

## 2010-12-19 NOTE — Op Note (Signed)
Operative note  Preoperative diagnosis Term pregnancy at 39+ weeks Arteriovenous malformation contraindicating labor  Postoperative diagnosis Same  Procedure Primary low transverse cesarean section with 2 layer closure of uterus  Surgeon Dr. Huel Cote Dr. Tracey Harries  Anesthesia Spinal  Findings A viable female infant in the floating vertex position Apgars were 8 and 9 weight pending at delivery the vertex did require vacuum assistance for delivery. Normal uterus ovaries and tubes were noted  Fluids Estimated blood loss 600 cc Urine output 150 cc clear urine IV fluid 2800 cc LR  Specimen Placenta sent to pathology  Procedure note After appropriate informed consent was obtained patient was taken to the operating room where spinal anesthesia was obtained without difficulty. A time out was performed and the patient was prepped and draped in the normal sterile fashion in the dorsal supine position with leftward tilt. A with a Foley catheter in place a Pfannenstiel skin incision was made approximately 2 cm above the symphysis pubis and carried through to the underlying layer of fascia by sharp dissection and Bovie cautery. The fascia was nicked in the midline and the incision was extended laterally with Mayo scissors. The inferior aspect of the incision was grasped with Coker clamps elevated and dissected off the underlying rectus muscles. In a similar fashion the superior aspect was dissected off the rectus muscles and these were separated in the midline with the peritoneal cavity entered sharply. Peritoneal incision was then extended both superiorly and inferiorly with careful attention to avoid both bowel bladder. The Alexis self-retaining wound retractor was then placed within the incision and the lower uterine segment exposed nicely. This was then incised in a transverse fashion and the cavity itself entered bluntly clear fluid was noted. The vertex was presenting but was  slightly ballotable and difficult to bring down to the incision therefore the Kiwi vacuum was applied and the vertex easily delivered atraumatically. The remainder of the infant delivered without difficulty and was handed off to the pediatricians after the cord was clamped and cut. The placenta was then spontaneously expressed and handed off to pathology. The uterus was cleared of all clots and debris with moist lap sponge and the uterine incision closed in 2 layers. The first layer was a running locked layer 1-0 chromic and the second an imbricating layer of the same suture. Good hemostasis was observed. The tubes and ovaries were inspected and the gutters cleared of all clots and debris. The Alexis retractor instruments were then removed from the abdomen. The peritoneum and rectus muscles were then closed in several interrupted mattress sutures of 2-0 Vicryl. The subfascial planes were inspected and found to be hemostatic and the fascia was closed with 0 Vicryl in a running fashion. The subcutaneous tissue was reapproximated with 30 plain in a running fashion and the skin was closed with 4-0 Vicryl in a subcuticular stitch on a Keith needle. Benzoin and Steri-Strips were placed and the patient was taken to the recovery room in good condition. All final instrument and sponge counts were correct.

## 2010-12-19 NOTE — Progress Notes (Signed)
UR chart review completed.  

## 2010-12-20 LAB — CBC
HCT: 36.1 % (ref 36.0–46.0)
Hemoglobin: 12.1 g/dL (ref 12.0–15.0)
MCH: 30.5 pg (ref 26.0–34.0)
MCHC: 33.5 g/dL (ref 30.0–36.0)
MCV: 90.9 fL (ref 78.0–100.0)

## 2010-12-20 NOTE — Progress Notes (Signed)
Subjective: Postpartum Day 1: Cesarean Delivery Patient reports incisional pain and tolerating PO.    Objective: Vital signs in last 24 hours: Temp:  [97.3 F (36.3 C)-98.7 F (37.1 C)] 98.1 F (36.7 C) (12/14 0350) Pulse Rate:  [59-101] 77  (12/14 0350) Resp:  [15-20] 18  (12/14 0350) BP: (93-117)/(50-73) 94/57 mmHg (12/14 0350) SpO2:  [96 %-98 %] 98 % (12/14 0350) Weight:  [80.287 kg (177 lb)] 177 lb (80.287 kg) (12/13 0843)  Physical Exam:  General: alert Lochia: appropriate Uterine Fundus: firm Incision: C/D/I    Basename 12/20/10 0500  HGB 12.1  HCT 36.1    Assessment/Plan: Status post Cesarean section. Doing well postoperatively.  Continue current care.  Oliver Pila 12/20/2010, 7:40 AM

## 2010-12-21 DIAGNOSIS — Z23 Encounter for immunization: Secondary | ICD-10-CM

## 2010-12-21 NOTE — Progress Notes (Signed)
Subjective: Postpartum Day 2: Cesarean Delivery Patient reports nausea, incisional pain, tolerating PO and no problems voiding.    Objective: Vital signs in last 24 hours: Temp:  [98.2 F (36.8 C)-98.7 F (37.1 C)] 98.2 F (36.8 C) (12/15 0558) Pulse Rate:  [66-78] 77  (12/15 0558) Resp:  [18-20] 18  (12/15 0558) BP: (97-112)/(63-71) 112/68 mmHg (12/15 0558)  Physical Exam:  General: alert and no distress Lochia: appropriate Uterine Fundus: firm Incision: healing well DVT Evaluation: No evidence of DVT seen on physical exam.   Basename 12/20/10 0500  HGB 12.1  HCT 36.1    Assessment/Plan: Status post Cesarean section. Doing well postoperatively.  Continue current care.  BOVARD,Caroline Matters 12/21/2010, 10:21 AM

## 2010-12-22 MED ORDER — OXYCODONE-ACETAMINOPHEN 5-325 MG PO TABS: 1.0000 | ORAL_TABLET | ORAL | Status: AC | PRN

## 2010-12-22 MED ORDER — PRENATAL PLUS 27-1 MG PO TABS: 1.0000 | ORAL_TABLET | Freq: Every day | ORAL | Status: DC

## 2010-12-22 MED ORDER — IBUPROFEN 800 MG PO TABS: 800.0000 mg | ORAL_TABLET | Freq: Four times a day (QID) | ORAL | Status: AC | PRN

## 2010-12-22 NOTE — Discharge Summary (Signed)
Obstetric Discharge Summary Reason for Admission: cesarean section Prenatal Procedures: none Intrapartum Procedures: cesarean: low cervical, transverse Postpartum Procedures: none Complications-Operative and Postpartum: none Hemoglobin  Date Value Range Status  12/20/2010 12.1  12.0-15.0 (g/dL) Final     HCT  Date Value Range Status  12/20/2010 36.1  36.0-46.0 (%) Final    Discharge Diagnoses: Term Pregnancy-delivered  Discharge Information: Date: 12/22/2010 Activity: pelvic rest Diet: routine Medications: PNV, Ibuprofen and Percocet Condition: stable Instructions: refer to practice specific booklet Discharge to: home Follow-up Information    Follow up with BOVARD,[name], MD. Make an appointment in 2 weeks. (incision check)    Contact information:   [address] [facility] [address] [place] [address] [phone]          Newborn Data: Live born female  Birth Weight: 6 lb 8.1 oz (2950 g) APGAR: 9, 9  Home with mother.  BOVARD,Kathleen Cooper 12/22/2010, 9:46 AM

## 2010-12-22 NOTE — Progress Notes (Signed)
Subjective: Postpartum Day 3: Cesarean Delivery Patient reports incisional pain, tolerating PO, + flatus and no problems voiding. Nl lochia   Objective: Vital signs in last 24 hours: Temp:  [98.1 F (36.7 C)-98.5 F (36.9 C)] 98.1 F (36.7 C) (12/16 0606) Pulse Rate:  [64-80] 80  (12/16 0606) Resp:  [18] 18  (12/16 0606) BP: (91-113)/(54-77) 110/73 mmHg (12/16 0606) SpO2:  [97 %-98 %] 98 % (12/16 0606)  Physical Exam:  General: alert and no distress Lochia: appropriate Uterine Fundus: firm Incision: healing well DVT Evaluation: No evidence of DVT seen on physical exam.   Basename 12/20/10 0500  HGB 12.1  HCT 36.1    Assessment/Plan: Status post Cesarean section. Doing well postoperatively.  Discharge home with standard precautions and return to clinic in 2 weeks for incision check.  D/C with Motrin/Percocet/ PNV.  BOVARD,Samari Bittinger 12/22/2010, 9:39 AM

## 2010-12-23 ENCOUNTER — Encounter (HOSPITAL_COMMUNITY): Payer: Self-pay | Admitting: Obstetrics and Gynecology

## 2011-03-20 ENCOUNTER — Other Ambulatory Visit: Payer: Self-pay | Admitting: *Deleted

## 2011-03-20 ENCOUNTER — Telehealth: Payer: Self-pay | Admitting: *Deleted

## 2011-03-20 DIAGNOSIS — R0989 Other specified symptoms and signs involving the circulatory and respiratory systems: Secondary | ICD-10-CM

## 2011-03-20 DIAGNOSIS — R0602 Shortness of breath: Secondary | ICD-10-CM

## 2011-03-20 NOTE — Telephone Encounter (Signed)
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      A neck CT with contrast would be the test to do.      However, she was pregnant at the time.      Can we follow up with her and see if she has delivered?  Have her discuss with her Ob if she can have a Neck CT with Contrast at this time.  If ok, we can check a BMET and schedule.      Thanks      [employer]

## 2011-03-20 NOTE — Telephone Encounter (Signed)
pt states she had her baby in 12/2010 a baby girl but states does not have medicaid right now and she is reapplying on April 09, 2011, and as soon as she finds out if she is eligible she wcb and schedule test, says she does not have insurance right now. I also advised per recommendations from Tereso Newcomer, PA-C that she needs to d/w OB to see if ok to have neck CT w/contrast, pt gave me verbal understanding to all of this today. I told pt that I would let Tereso Newcomer, PA-C know of her circumstances right now, pt said thank you for all of our help, I told her she was welcome. Danielle Rankin

## 2011-05-15 ENCOUNTER — Other Ambulatory Visit (HOSPITAL_COMMUNITY): Payer: Self-pay | Admitting: Internal Medicine

## 2011-05-15 DIAGNOSIS — M791 Myalgia, unspecified site: Secondary | ICD-10-CM

## 2011-05-20 ENCOUNTER — Encounter: Payer: Self-pay | Admitting: Physician Assistant

## 2011-05-20 ENCOUNTER — Telehealth: Payer: Self-pay | Admitting: *Deleted

## 2011-05-20 ENCOUNTER — Ambulatory Visit (HOSPITAL_COMMUNITY)
Admission: RE | Admit: 2011-05-20 | Discharge: 2011-05-20 | Disposition: A | Discharge status: Home / Self Care | Payer: Medicaid Other | Source: Ambulatory Visit | Attending: Internal Medicine | Admitting: Internal Medicine

## 2011-05-20 ENCOUNTER — Other Ambulatory Visit (HOSPITAL_COMMUNITY): Payer: Self-pay | Admitting: Internal Medicine

## 2011-05-20 DIAGNOSIS — R0602 Shortness of breath: Secondary | ICD-10-CM | POA: Insufficient documentation

## 2011-05-20 DIAGNOSIS — R0989 Other specified symptoms and signs involving the circulatory and respiratory systems: Secondary | ICD-10-CM | POA: Insufficient documentation

## 2011-05-20 DIAGNOSIS — I7789 Other specified disorders of arteries and arterioles: Secondary | ICD-10-CM | POA: Insufficient documentation

## 2011-05-20 DIAGNOSIS — M791 Myalgia, unspecified site: Secondary | ICD-10-CM

## 2011-05-20 DIAGNOSIS — R000 Tachycardia, unspecified: Secondary | ICD-10-CM | POA: Insufficient documentation

## 2011-05-20 MED ORDER — IOHEXOL 300 MG/ML  SOLN: 100.0000 mL | Freq: Once | INTRAMUSCULAR | Status: DC | PRN

## 2011-05-20 MED ORDER — IOHEXOL 350 MG/ML SOLN
80.0000 mL | Freq: Once | INTRAVENOUS | Status: AC | PRN
  Administered 2011-05-20: 80 mL via INTRAVENOUS

## 2011-05-20 NOTE — Telephone Encounter (Signed)
Message copied by Tarri Fuller on Tue May 20, 2011  4:57 PM ------      Message from: [place], [place] T      Created: Tue May 20, 2011  2:08 PM       Neck CT ok - no blockages or abnormalities      Tereso Newcomer, PA-C  2:06 PM 05/20/2011

## 2011-05-20 NOTE — Telephone Encounter (Signed)
pt notified of ct results.      

## 2011-10-29 DIAGNOSIS — K219 Gastro-esophageal reflux disease without esophagitis: Secondary | ICD-10-CM | POA: Insufficient documentation

## 2011-11-03 ENCOUNTER — Ambulatory Visit: Payer: Medicaid Other | Attending: Nurse Practitioner | Admitting: Physical Therapy

## 2011-11-03 DIAGNOSIS — IMO0001 Reserved for inherently not codable concepts without codable children: Secondary | ICD-10-CM | POA: Insufficient documentation

## 2011-11-03 DIAGNOSIS — M542 Cervicalgia: Secondary | ICD-10-CM | POA: Insufficient documentation

## 2011-11-03 DIAGNOSIS — M546 Pain in thoracic spine: Secondary | ICD-10-CM | POA: Insufficient documentation

## 2011-11-03 DIAGNOSIS — M25519 Pain in unspecified shoulder: Secondary | ICD-10-CM | POA: Insufficient documentation

## 2011-11-10 ENCOUNTER — Ambulatory Visit: Payer: Medicaid Other | Attending: Nurse Practitioner | Admitting: Physical Therapy

## 2011-11-10 DIAGNOSIS — M546 Pain in thoracic spine: Secondary | ICD-10-CM | POA: Insufficient documentation

## 2011-11-10 DIAGNOSIS — M542 Cervicalgia: Secondary | ICD-10-CM | POA: Insufficient documentation

## 2011-11-10 DIAGNOSIS — IMO0001 Reserved for inherently not codable concepts without codable children: Secondary | ICD-10-CM | POA: Insufficient documentation

## 2011-11-10 DIAGNOSIS — M25519 Pain in unspecified shoulder: Secondary | ICD-10-CM | POA: Insufficient documentation

## 2011-11-17 ENCOUNTER — Ambulatory Visit: Payer: Medicaid Other | Admitting: Physical Therapy

## 2011-11-24 ENCOUNTER — Encounter (INDEPENDENT_AMBULATORY_CARE_PROVIDER_SITE_OTHER): Payer: Self-pay | Admitting: General Surgery

## 2011-11-24 ENCOUNTER — Ambulatory Visit: Payer: Medicaid Other | Admitting: Physical Therapy

## 2011-11-26 ENCOUNTER — Encounter: Payer: Medicaid Other | Admitting: Physical Therapy

## 2011-11-26 ENCOUNTER — Ambulatory Visit: Payer: Medicaid Other | Admitting: Physical Therapy

## 2011-12-01 ENCOUNTER — Encounter (INDEPENDENT_AMBULATORY_CARE_PROVIDER_SITE_OTHER): Payer: Self-pay | Admitting: Surgery

## 2011-12-01 ENCOUNTER — Ambulatory Visit (INDEPENDENT_AMBULATORY_CARE_PROVIDER_SITE_OTHER): Payer: Medicaid Other | Admitting: Surgery

## 2011-12-01 VITALS — BP 108/68 | HR 68 | Temp 97.6°F | Resp 14 | Ht 63.0 in | Wt 140.0 lb

## 2011-12-01 DIAGNOSIS — K8020 Calculus of gallbladder without cholecystitis without obstruction: Secondary | ICD-10-CM

## 2011-12-01 HISTORY — DX: Calculus of gallbladder without cholecystitis without obstruction: K80.20

## 2011-12-01 NOTE — Progress Notes (Signed)
Patient ID: Kathleen Cooper, female   DOB: [birth year], 30 y.o.   MRN: [id number]  Chief Complaint  Patient presents with  . Cholelithiasis    new pt- eval GB w/ stones    HPI Kathleen Cooper is a 31 y.o. female.   HPI This is a pleasant female referred by Dr. Cameron Sprang for evaluation of right-sided abdominal pain, nausea, and emesis. The patient reports this has been going on for several years. The attacks have been becoming more frequent. The pain is mild to moderate in the right upper quadrant. It is described as sharp. She also has constipation. She denies jaundice. Past Medical History  Diagnosis Date  . History of infertility, female     Unexplained  . Tachycardia   . Neurological disease     Venous angioma  . Hx of CT scan 05/2011    Neck: no ICA stenosis or FMD of carotid arteries   . GERD (gastroesophageal reflux disease)     Past Surgical History  Procedure Date  . Laparoscopy     Intertility  . Cesarean section 12/19/2010    Procedure: CESAREAN SECTION;  Surgeon: Oliver Pila;  Location: [facility];  Service: Gynecology;;  Primary cesarean section.     Family History  Problem Relation Age of Onset  . Hyperlipidemia Mother 44    alive  . Diabetes Father   . Coronary artery disease Father 14    alive    Social History History  Substance Use Topics  . Smoking status: Current Some Day Smoker  . Smokeless tobacco: Not on file     Comment: smokes hookah occ.  Marland Kitchen Alcohol Use: No    No Known Allergies  Current Outpatient Prescriptions  Medication Sig Dispense Refill  . acetaminophen (TYLENOL) 325 MG tablet Take 650 mg by mouth every 6 (six) hours as needed. For pain and aches       . bisacodyl (DULCOLAX) 5 MG EC tablet Take 5 mg by mouth daily as needed.      . diclofenac sodium (VOLTAREN) 1 % GEL Apply topically.      Marland Kitchen ibuprofen (ADVIL,MOTRIN) 200 MG tablet Take 200 mg by mouth every 6 (six) hours as needed.      Marland Kitchen omeprazole (PRILOSEC) 40 MG capsule Take  40 mg by mouth daily.      . prenatal vitamin w/FE, FA (PRENATAL 1 + 1) 27-1 MG TABS Take 1 tablet by mouth daily.        . prenatal vitamin w/FE, FA (PRENATAL 1 + 1) 27-1 MG TABS Take 1 tablet by mouth daily.  30 each  12    Review of Systems Review of Systems  Constitutional: Negative for fever, chills and unexpected weight change.  HENT: Negative for hearing loss, congestion, sore throat, trouble swallowing and voice change.   Eyes: Negative for visual disturbance.  Respiratory: Negative for cough and wheezing.   Cardiovascular: Negative for chest pain, palpitations and leg swelling.  Gastrointestinal: Positive for nausea, vomiting, abdominal pain and constipation. Negative for diarrhea, blood in stool, abdominal distention and anal bleeding.  Genitourinary: Negative for hematuria, vaginal bleeding and difficulty urinating.  Musculoskeletal: Negative for arthralgias.  Skin: Negative for rash and wound.  Neurological: Negative for seizures, syncope and headaches.  Hematological: Negative for adenopathy. Does not bruise/bleed easily.  Psychiatric/Behavioral: Negative for confusion.    Blood pressure 108/68, pulse 68, temperature 97.6 F (36.4 C), temperature source Temporal, resp. rate 14, height 5\' 3"  (1.6 m), weight 140 lb (  63.504 kg).  Physical Exam Physical Exam  Constitutional: She is oriented to person, place, and time. She appears well-developed and well-nourished. No distress.  HENT:  Head: Normocephalic and atraumatic.  Right Ear: External ear normal.  Left Ear: External ear normal.  Nose: Nose normal.  Mouth/Throat: Oropharynx is clear and moist. No oropharyngeal exudate.  Eyes: Conjunctivae normal are normal. Pupils are equal, round, and reactive to light. Right eye exhibits no discharge. Left eye exhibits no discharge. No scleral icterus.  Neck: Normal range of motion. Neck supple. No tracheal deviation present. No thyromegaly present.  Cardiovascular: Normal rate,  regular rhythm, normal heart sounds and intact distal pulses.   No murmur heard. Pulmonary/Chest: Effort normal and breath sounds normal. No respiratory distress. She has no wheezes. She has no rales.  Abdominal: Soft. Bowel sounds are normal. She exhibits no distension. There is no tenderness. There is no rebound.  Musculoskeletal: Normal range of motion. She exhibits no edema and no tenderness.  Lymphadenopathy:    She has no cervical adenopathy.  Neurological: She is alert and oriented to person, place, and time.  Skin: Skin is warm and dry. No rash noted. She is not diaphoretic. No erythema.  Psychiatric: Her behavior is normal. Judgment normal.    Data Reviewed I have reviewed her ultrasound showing a gallstone impacted in the gallbladder neck. There is no gallbladder wall thickening, pericholecystic fluid, or enlargement of the bile duct  Assessment    Symptomatic cholelithiasis    Plan    Laparoscopic cholecystectomy with possible cholangiogram is recommended. I discussed this with the patient in detail. I discussed the risks of surgery which includes but is not limited to bleeding, infection, bile duct injury, bile leak, need to convert to an open procedure, et Karie Soda. She understands and wishes to proceed. Surgery will be scheduled. Likelihood of success is good.       Kathleen Cooper A 12/01/2011, 10:17 AM

## 2011-12-07 DIAGNOSIS — K8020 Calculus of gallbladder without cholecystitis without obstruction: Secondary | ICD-10-CM

## 2011-12-07 HISTORY — DX: Calculus of gallbladder without cholecystitis without obstruction: K80.20

## 2011-12-12 ENCOUNTER — Encounter (HOSPITAL_BASED_OUTPATIENT_CLINIC_OR_DEPARTMENT_OTHER): Payer: Self-pay | Admitting: *Deleted

## 2011-12-12 DIAGNOSIS — K5900 Constipation, unspecified: Secondary | ICD-10-CM

## 2011-12-12 HISTORY — DX: Constipation, unspecified: K59.00

## 2011-12-12 NOTE — Pre-Procedure Instructions (Signed)
To come for CMET 

## 2011-12-15 ENCOUNTER — Encounter (HOSPITAL_BASED_OUTPATIENT_CLINIC_OR_DEPARTMENT_OTHER)
Admission: RE | Admit: 2011-12-15 | Discharge: 2011-12-15 | Disposition: A | Discharge status: Home / Self Care | Payer: Medicaid Other | Source: Ambulatory Visit | Attending: Surgery | Admitting: Surgery

## 2011-12-15 LAB — COMPREHENSIVE METABOLIC PANEL
AST: 20 U/L (ref 0–37)
Albumin: 3.8 g/dL (ref 3.5–5.2)
Alkaline Phosphatase: 74 U/L (ref 39–117)
BUN: 9 mg/dL (ref 6–23)
Chloride: 103 mEq/L (ref 96–112)
Potassium: 3.6 mEq/L (ref 3.5–5.1)
Sodium: 139 mEq/L (ref 135–145)
Total Protein: 7 g/dL (ref 6.0–8.3)

## 2011-12-15 NOTE — Pre-Procedure Instructions (Signed)
Discussed hx. of venous angioma with Dr. Jacklynn Bue; pt. OK to come for surgery.

## 2011-12-17 NOTE — H&P (Signed)
Patient ID: Kathleen Cooper, female DOB: 02-Jul-1980, 31 y.o. MRN: [id number]  Chief Complaint   Patient presents with   .  Cholelithiasis     new pt- eval GB w/ stones    HPI  Kathleen Cooper is a 31 y.o. female.  HPI  This is a pleasant female referred by Dr. Cameron Sprang for evaluation of right-sided abdominal pain, nausea, and emesis. The patient reports this has been going on for several years. The attacks have been becoming more frequent. The pain is mild to moderate in the right upper quadrant. It is described as sharp. She also has constipation. She denies jaundice.  Past Medical History   Diagnosis  Date   .  History of infertility, female      Unexplained   .  Tachycardia    .  Neurological disease      Venous angioma   .  Hx of CT scan  05/2011     Neck: no ICA stenosis or FMD of carotid arteries   .  GERD (gastroesophageal reflux disease)     Past Surgical History   Procedure  Date   .  Laparoscopy      Intertility   .  Cesarean section  12/19/2010     Procedure: CESAREAN SECTION; Surgeon: Oliver Pila; Location: [facility]; Service: Gynecology;; Primary cesarean section.    Family History   Problem  Relation  Age of Onset   .  Hyperlipidemia  Mother  46      alive    .  Diabetes  Father    .  Coronary artery disease  Father  104      alive    Social History  History   Substance Use Topics   .  Smoking status:  Current Some Day Smoker   .  Smokeless tobacco:  Not on file      Comment: smokes hookah occ.   Marland Kitchen  Alcohol Use:  No    No Known Allergies  Current Outpatient Prescriptions   Medication  Sig  Dispense  Refill   .  acetaminophen (TYLENOL) 325 MG tablet  Take 650 mg by mouth every 6 (six) hours as needed. For pain and aches     .  bisacodyl (DULCOLAX) 5 MG EC tablet  Take 5 mg by mouth daily as needed.     .  diclofenac sodium (VOLTAREN) 1 % GEL  Apply topically.     Marland Kitchen  ibuprofen (ADVIL,MOTRIN) 200 MG tablet  Take 200 mg by mouth every 6 (six) hours as  needed.     Marland Kitchen  omeprazole (PRILOSEC) 40 MG capsule  Take 40 mg by mouth daily.     .  prenatal vitamin w/FE, FA (PRENATAL 1 + 1) 27-1 MG TABS  Take 1 tablet by mouth daily.     .  prenatal vitamin w/FE, FA (PRENATAL 1 + 1) 27-1 MG TABS  Take 1 tablet by mouth daily.  30 each  12    Review of Systems  Review of Systems  Constitutional: Negative for fever, chills and unexpected weight change.  HENT: Negative for hearing loss, congestion, sore throat, trouble swallowing and voice change.  Eyes: Negative for visual disturbance.  Respiratory: Negative for cough and wheezing.  Cardiovascular: Negative for chest pain, palpitations and leg swelling.  Gastrointestinal: Positive for nausea, vomiting, abdominal pain and constipation. Negative for diarrhea, blood in stool, abdominal distention and anal bleeding.  Genitourinary: Negative for hematuria, vaginal bleeding and difficulty urinating.  Musculoskeletal: Negative for arthralgias.  Skin: Negative for rash and wound.  Neurological: Negative for seizures, syncope and headaches.  Hematological: Negative for adenopathy. Does not bruise/bleed easily.  Psychiatric/Behavioral: Negative for confusion.   Blood pressure 108/68, pulse 68, temperature 97.6 F (36.4 C), temperature source Temporal, resp. rate 14, height 5\' 3"  (1.6 m), weight 140 lb (63.504 kg).  Physical Exam  Physical Exam  Constitutional: She is oriented to person, place, and time. She appears well-developed and well-nourished. No distress.  HENT:  Head: Normocephalic and atraumatic.  Right Ear: External ear normal.  Left Ear: External ear normal.  Nose: Nose normal.  Mouth/Throat: Oropharynx is clear and moist. No oropharyngeal exudate.  Eyes: Conjunctivae normal are normal. Pupils are equal, round, and reactive to light. Right eye exhibits no discharge. Left eye exhibits no discharge. No scleral icterus.  Neck: Normal range of motion. Neck supple. No tracheal deviation present. No  thyromegaly present.  Cardiovascular: Normal rate, regular rhythm, normal heart sounds and intact distal pulses.  No murmur heard.  Pulmonary/Chest: Effort normal and breath sounds normal. No respiratory distress. She has no wheezes. She has no rales.  Abdominal: Soft. Bowel sounds are normal. She exhibits no distension. There is no tenderness. There is no rebound.  Musculoskeletal: Normal range of motion. She exhibits no edema and no tenderness.  Lymphadenopathy:  She has no cervical adenopathy.  Neurological: She is alert and oriented to person, place, and time.  Skin: Skin is warm and dry. No rash noted. She is not diaphoretic. No erythema.  Psychiatric: Her behavior is normal. Judgment normal.   Data Reviewed  I have reviewed her ultrasound showing a gallstone impacted in the gallbladder neck. There is no gallbladder wall thickening, pericholecystic fluid, or enlargement of the bile duct  Assessment   Symptomatic cholelithiasis   Plan   Laparoscopic cholecystectomy with possible cholangiogram is recommended. I discussed this with the patient in detail. I discussed the risks of surgery which includes but is not limited to bleeding, infection, bile duct injury, bile leak, need to convert to an open procedure, et Karie Soda. She understands and wishes to proceed. Surgery will be scheduled. Likelihood of success is good.

## 2011-12-18 ENCOUNTER — Encounter (HOSPITAL_BASED_OUTPATIENT_CLINIC_OR_DEPARTMENT_OTHER): Payer: Self-pay | Admitting: Certified Registered Nurse Anesthetist

## 2011-12-18 ENCOUNTER — Ambulatory Visit (HOSPITAL_BASED_OUTPATIENT_CLINIC_OR_DEPARTMENT_OTHER)
Admission: RE | Admit: 2011-12-18 | Discharge: 2011-12-18 | Disposition: A | Discharge status: Home / Self Care | Payer: Medicaid Other | Source: Ambulatory Visit | Attending: Surgery | Admitting: Surgery

## 2011-12-18 ENCOUNTER — Ambulatory Visit (HOSPITAL_BASED_OUTPATIENT_CLINIC_OR_DEPARTMENT_OTHER): Payer: Medicaid Other | Admitting: Certified Registered Nurse Anesthetist

## 2011-12-18 ENCOUNTER — Encounter (HOSPITAL_BASED_OUTPATIENT_CLINIC_OR_DEPARTMENT_OTHER)
Admission: RE | Disposition: A | Discharge status: Home / Self Care | Payer: Self-pay | Source: Ambulatory Visit | Attending: Surgery

## 2011-12-18 DIAGNOSIS — Z833 Family history of diabetes mellitus: Secondary | ICD-10-CM | POA: Insufficient documentation

## 2011-12-18 DIAGNOSIS — K8010 Calculus of gallbladder with chronic cholecystitis without obstruction: Secondary | ICD-10-CM

## 2011-12-18 DIAGNOSIS — K219 Gastro-esophageal reflux disease without esophagitis: Secondary | ICD-10-CM | POA: Insufficient documentation

## 2011-12-18 DIAGNOSIS — F17200 Nicotine dependence, unspecified, uncomplicated: Secondary | ICD-10-CM | POA: Insufficient documentation

## 2011-12-18 HISTORY — DX: Calculus of gallbladder without cholecystitis without obstruction: K80.20

## 2011-12-18 HISTORY — DX: Dental restoration status: Z98.811

## 2011-12-18 HISTORY — DX: Constipation, unspecified: K59.00

## 2011-12-18 HISTORY — DX: Other complications of anesthesia, initial encounter: T88.59XA

## 2011-12-18 HISTORY — DX: Hemangioma of intracranial structures: D18.02

## 2011-12-18 HISTORY — PX: CHOLECYSTECTOMY: SHX55

## 2011-12-18 HISTORY — DX: Adverse effect of unspecified anesthetic, initial encounter: T41.45XA

## 2011-12-18 SURGERY — LAPAROSCOPIC CHOLECYSTECTOMY
Anesthesia: General | Site: Abdomen | Wound class: Contaminated

## 2011-12-18 MED ORDER — ACETAMINOPHEN 650 MG RE SUPP: 650.0000 mg | RECTAL | Status: DC | PRN

## 2011-12-18 MED ORDER — MIDAZOLAM HCL 2 MG/2ML IJ SOLN: 1.0000 mg | INTRAMUSCULAR | Status: DC | PRN

## 2011-12-18 MED ORDER — KETOROLAC TROMETHAMINE 30 MG/ML IJ SOLN
INTRAMUSCULAR | Status: DC | PRN
  Administered 2011-12-18: 30 mg via INTRAVENOUS

## 2011-12-18 MED ORDER — OXYCODONE-ACETAMINOPHEN 5-325 MG PO TABS: ORAL_TABLET | ORAL | Status: DC

## 2011-12-18 MED ORDER — LACTATED RINGERS IV SOLN
INTRAVENOUS | Status: DC
  Administered 2011-12-18 (×3): via INTRAVENOUS

## 2011-12-18 MED ORDER — SODIUM CHLORIDE 0.9 % IV SOLN: 250.0000 mL | INTRAVENOUS | Status: DC | PRN

## 2011-12-18 MED ORDER — ONDANSETRON HCL 4 MG/2ML IJ SOLN: 4.0000 mg | Freq: Four times a day (QID) | INTRAMUSCULAR | Status: DC | PRN

## 2011-12-18 MED ORDER — OXYCODONE HCL 5 MG PO TABS: 5.0000 mg | ORAL_TABLET | ORAL | Status: DC | PRN

## 2011-12-18 MED ORDER — BUPIVACAINE-EPINEPHRINE PF 0.5-1:200000 % IJ SOLN: INTRAMUSCULAR | Status: DC | PRN

## 2011-12-18 MED ORDER — FENTANYL CITRATE 0.05 MG/ML IJ SOLN: 50.0000 ug | Freq: Once | INTRAMUSCULAR | Status: DC

## 2011-12-18 MED ORDER — SODIUM CHLORIDE 0.9 % IJ SOLN: 3.0000 mL | Freq: Two times a day (BID) | INTRAMUSCULAR | Status: DC

## 2011-12-18 MED ORDER — HYDROMORPHONE HCL PF 1 MG/ML IJ SOLN
0.2500 mg | INTRAMUSCULAR | Status: DC | PRN
  Administered 2011-12-18: 0.25 mg via INTRAVENOUS

## 2011-12-18 MED ORDER — NEOSTIGMINE METHYLSULFATE 1 MG/ML IJ SOLN
INTRAMUSCULAR | Status: DC | PRN
  Administered 2011-12-18: 3.5 mg via INTRAVENOUS

## 2011-12-18 MED ORDER — CEFAZOLIN SODIUM-DEXTROSE 2-3 GM-% IV SOLR
2.0000 g | INTRAVENOUS | Status: AC
  Administered 2011-12-18: 2 g via INTRAVENOUS

## 2011-12-18 MED ORDER — LIDOCAINE HCL (CARDIAC) 20 MG/ML IV SOLN
INTRAVENOUS | Status: DC | PRN
  Administered 2011-12-18: 60 mg via INTRAVENOUS

## 2011-12-18 MED ORDER — PROPOFOL 10 MG/ML IV BOLUS
INTRAVENOUS | Status: DC | PRN
  Administered 2011-12-18: 180 mg via INTRAVENOUS

## 2011-12-18 MED ORDER — SODIUM CHLORIDE 0.9 % IR SOLN
Status: DC | PRN
  Administered 2011-12-18: 1

## 2011-12-18 MED ORDER — OXYCODONE HCL 5 MG/5ML PO SOLN: 5.0000 mg | Freq: Once | ORAL | Status: AC | PRN

## 2011-12-18 MED ORDER — GLYCOPYRROLATE 0.2 MG/ML IJ SOLN
INTRAMUSCULAR | Status: DC | PRN
  Administered 2011-12-18: .5 mg via INTRAVENOUS

## 2011-12-18 MED ORDER — SODIUM CHLORIDE 0.9 % IJ SOLN: 3.0000 mL | INTRAMUSCULAR | Status: DC | PRN

## 2011-12-18 MED ORDER — ACETAMINOPHEN 325 MG PO TABS: 650.0000 mg | ORAL_TABLET | ORAL | Status: DC | PRN

## 2011-12-18 MED ORDER — MORPHINE SULFATE 4 MG/ML IJ SOLN: 4.0000 mg | INTRAMUSCULAR | Status: DC | PRN

## 2011-12-18 MED ORDER — FENTANYL CITRATE 0.05 MG/ML IJ SOLN
INTRAMUSCULAR | Status: DC | PRN
  Administered 2011-12-18: 50 ug via INTRAVENOUS

## 2011-12-18 MED ORDER — ROCURONIUM BROMIDE 100 MG/10ML IV SOLN
INTRAVENOUS | Status: DC | PRN
  Administered 2011-12-18: 30 mg via INTRAVENOUS

## 2011-12-18 MED ORDER — MIDAZOLAM HCL 5 MG/5ML IJ SOLN
INTRAMUSCULAR | Status: DC | PRN
  Administered 2011-12-18: 1 mg via INTRAVENOUS

## 2011-12-18 MED ORDER — PROMETHAZINE HCL 25 MG/ML IJ SOLN: 6.2500 mg | INTRAMUSCULAR | Status: DC | PRN

## 2011-12-18 MED ORDER — OXYCODONE HCL 5 MG PO TABS
5.0000 mg | ORAL_TABLET | Freq: Once | ORAL | Status: AC | PRN
  Administered 2011-12-18: 5 mg via ORAL

## 2011-12-18 MED ORDER — DEXAMETHASONE SODIUM PHOSPHATE 4 MG/ML IJ SOLN
INTRAMUSCULAR | Status: DC | PRN
  Administered 2011-12-18: 10 mg via INTRAVENOUS

## 2011-12-18 MED ORDER — BUPIVACAINE-EPINEPHRINE 0.25% -1:200000 IJ SOLN
INTRAMUSCULAR | Status: DC | PRN
  Administered 2011-12-18: 20 mL

## 2011-12-18 MED ORDER — ONDANSETRON HCL 4 MG/2ML IJ SOLN
INTRAMUSCULAR | Status: DC | PRN
  Administered 2011-12-18: 4 mg via INTRAVENOUS

## 2011-12-18 SURGICAL SUPPLY — 36 items
APL SKNCLS STERI-STRIP NONHPOA (GAUZE/BANDAGES/DRESSINGS) ×2
APPLIER CLIP 5 13 M/L LIGAMAX5 (MISCELLANEOUS) ×3
APR CLP MED LRG 5 ANG JAW (MISCELLANEOUS) ×2
BAG SPEC RTRVL LRG 6X4 10 (ENDOMECHANICALS)
BANDAGE ADHESIVE 1X3 (GAUZE/BANDAGES/DRESSINGS) ×12 IMPLANT
BENZOIN TINCTURE PRP APPL 2/3 (GAUZE/BANDAGES/DRESSINGS) ×3 IMPLANT
BLADE SURG ROTATE 9660 (MISCELLANEOUS) IMPLANT
CANISTER SUCTION 2500CC (MISCELLANEOUS) ×3 IMPLANT
CHLORAPREP W/TINT 26ML (MISCELLANEOUS) ×3 IMPLANT
CLIP APPLIE 5 13 M/L LIGAMAX5 (MISCELLANEOUS) ×2 IMPLANT
CLOTH BEACON ORANGE TIMEOUT ST (SAFETY) ×3 IMPLANT
COVER MAYO STAND STRL (DRAPES) IMPLANT
DECANTER SPIKE VIAL GLASS SM (MISCELLANEOUS) ×3 IMPLANT
DRAPE C-ARM 42X72 X-RAY (DRAPES) IMPLANT
DRAPE UTILITY XL STRL (DRAPES) ×3 IMPLANT
ELECT REM PT RETURN 9FT ADLT (ELECTROSURGICAL) ×3
ELECTRODE REM PT RTRN 9FT ADLT (ELECTROSURGICAL) ×2 IMPLANT
GLOVE SURG SIGNA 7.5 PF LTX (GLOVE) ×3 IMPLANT
GOWN PREVENTION PLUS XLARGE (GOWN DISPOSABLE) ×3 IMPLANT
GOWN STRL NON-REIN LRG LVL3 (GOWN DISPOSABLE) ×9 IMPLANT
NS IRRIG 1000ML POUR BTL (IV SOLUTION) ×3 IMPLANT
PACK BASIN DAY SURGERY FS (CUSTOM PROCEDURE TRAY) ×3 IMPLANT
POUCH SPECIMEN RETRIEVAL 10MM (ENDOMECHANICALS) IMPLANT
SCISSORS LAP 5X35 DISP (ENDOMECHANICALS) IMPLANT
SET CHOLANGIOGRAPH 5 50 .035 (SET/KITS/TRAYS/PACK) IMPLANT
SET IRRIG TUBING LAPAROSCOPIC (IRRIGATION / IRRIGATOR) ×3 IMPLANT
SLEEVE ENDOPATH XCEL 5M (ENDOMECHANICALS) ×6 IMPLANT
SLEEVE SCD COMPRESS KNEE MED (MISCELLANEOUS) ×3 IMPLANT
SPECIMEN JAR SMALL (MISCELLANEOUS) ×3 IMPLANT
SUT MON AB 4-0 PC3 18 (SUTURE) ×3 IMPLANT
TOWEL OR 17X24 6PK STRL BLUE (TOWEL DISPOSABLE) ×3 IMPLANT
TOWEL OR NON WOVEN STRL DISP B (DISPOSABLE) ×3 IMPLANT
TRAY LAPAROSCOPIC (CUSTOM PROCEDURE TRAY) ×3 IMPLANT
TROCAR XCEL BLUNT TIP 100MML (ENDOMECHANICALS) ×3 IMPLANT
TROCAR XCEL NON-BLD 5MMX100MML (ENDOMECHANICALS) ×3 IMPLANT
TUBE CONNECTING 20X1/4 (TUBING) ×3 IMPLANT

## 2011-12-18 NOTE — Anesthesia Postprocedure Evaluation (Signed)
  Anesthesia Post-op Note  Patient: Kathleen Cooper  Procedure(s) Performed: Procedure(s) (LRB) with comments: LAPAROSCOPIC CHOLECYSTECTOMY (N/A)  Patient Location: PACU  Anesthesia Type:General  Level of Consciousness: awake and alert   Airway and Oxygen Therapy: Patient Spontanous Breathing  Post-op Pain: mild  Post-op Assessment: Post-op Vital signs reviewed, Patient's Cardiovascular Status Stable, Respiratory Function Stable, Patent Airway, No signs of Nausea or vomiting and Pain level controlled  Post-op Vital Signs: stable  Complications: No apparent anesthesia complications

## 2011-12-18 NOTE — Anesthesia Preprocedure Evaluation (Signed)
Anesthesia Evaluation  Patient identified by MRN, date of birth, ID band Patient awake    Reviewed: Allergy & Precautions, H&P , NPO status , Patient's Chart, lab work & pertinent test results  Airway Mallampati: I TM Distance: >3 FB Neck ROM: Full    Dental   Pulmonary shortness of breath, Current Smoker,  breath sounds clear to auscultation        Cardiovascular Rhythm:Regular Rate:Normal     Neuro/Psych    GI/Hepatic GERD-  ,  Endo/Other    Renal/GU      Musculoskeletal   Abdominal   Peds  Hematology   Anesthesia Other Findings   Reproductive/Obstetrics                           Anesthesia Physical Anesthesia Plan  ASA: II  Anesthesia Plan: General   Post-op Pain Management:    Induction: Intravenous  Airway Management Planned: Oral ETT  Additional Equipment:   Intra-op Plan:   Post-operative Plan: Extubation in OR  Informed Consent: I have reviewed the patients History and Physical, chart, labs and discussed the procedure including the risks, benefits and alternatives for the proposed anesthesia with the patient or authorized representative who has indicated his/her understanding and acceptance.     Plan Discussed with: CRNA and Surgeon  Anesthesia Plan Comments:         Anesthesia Quick Evaluation

## 2011-12-18 NOTE — Transfer of Care (Signed)
Immediate Anesthesia Transfer of Care Note  Patient: Kathleen Cooper  Procedure(s) Performed: Procedure(s) (LRB) with comments: LAPAROSCOPIC CHOLECYSTECTOMY (N/A) INTRAOPERATIVE CHOLANGIOGRAM (N/A)  Patient Location: PACU  Anesthesia Type:General  Level of Consciousness: awake, alert , oriented and patient cooperative  Airway & Oxygen Therapy: Patient Spontanous Breathing and Patient connected to face mask oxygen  Post-op Assessment: Report given to PACU RN and Post -op Vital signs reviewed and stable  Post vital signs: Reviewed and stable  Complications: No apparent anesthesia complications

## 2011-12-18 NOTE — Op Note (Signed)
Laparoscopic Cholecystectomy Procedure Note  Indications: This patient presents with symptomatic gallbladder disease and will undergo laparoscopic cholecystectomy.  Pre-operative Diagnosis: Calculus of gallbladder with other cholecystitis and obstruction  Post-operative Diagnosis: Same  Surgeon: Abigail Miyamoto A   Assistants: 0  Anesthesia: General endotracheal anesthesia  ASA Class: 1  Procedure Details  The patient was seen again in the Holding Room. The risks, benefits, complications, treatment options, and expected outcomes were discussed with the patient. The possibilities of reaction to medication, pulmonary aspiration, perforation of viscus, bleeding, recurrent infection, finding a normal gallbladder, the need for additional procedures, failure to diagnose a condition, the possible need to convert to an open procedure, and creating a complication requiring transfusion or operation were discussed with the patient. The likelihood of improving the patient's symptoms with return to their baseline status is good.  The patient and/or family concurred with the proposed plan, giving informed consent. The site of surgery properly noted. The patient was taken to Operating Room, identified as Kathleen Cooper and the procedure verified as Laparoscopic Cholecystectomy with Intraoperative Cholangiogram. A Time Out was held and the above information confirmed.  Prior to the induction of general anesthesia, antibiotic prophylaxis was administered. General endotracheal anesthesia was then administered and tolerated well. After the induction, the abdomen was prepped with Chloraprep and draped in sterile fashion. The patient was positioned in the supine position.  Local anesthetic agent was injected into the skin near the umbilicus and an incision made. We dissected down to the abdominal fascia with blunt dissection.  The fascia was incised vertically and we entered the peritoneal cavity bluntly.  A  pursestring suture of 0-Vicryl was placed around the fascial opening.  The Hasson cannula was inserted and secured with the stay suture.  Pneumoperitoneum was then created with CO2 and tolerated well without any adverse changes in the patient's vital signs. An 11-mm port was placed in the subxiphoid position.  Two 5-mm ports were placed in the right upper quadrant. All skin incisions were infiltrated with a local anesthetic agent before making the incision and placing the trocars.   We positioned the patient in reverse Trendelenburg, tilted slightly to the patient's left.  The gallbladder was identified, the fundus grasped and retracted cephalad. Adhesions were lysed bluntly and with the electrocautery where indicated, taking care not to injure any adjacent organs or viscus. The infundibulum was grasped and retracted laterally, exposing the peritoneum overlying the triangle of Calot. This was then divided and exposed in a blunt fashion. The cystic duct was clearly identified and bluntly dissected circumferentially. A critical view of the cystic duct and cystic artery was obtained.  The cystic duct was then ligated with clips and divided. The cystic artery was, dissected free, ligated with clips and divided as well.   The gallbladder was dissected from the liver bed in retrograde fashion with the electrocautery. The gallbladder was removed and placed in an Endocatch sac. The liver bed was irrigated and inspected. Hemostasis was achieved with the electrocautery. Copious irrigation was utilized and was repeatedly aspirated until clear.  The gallbladder and Endocatch sac were then removed through the umbilical port site.  The pursestring suture was used to close the umbilical fascia.    We again inspected the right upper quadrant for hemostasis.  Pneumoperitoneum was released as we removed the trocars.  4-0 Monocryl was used to close the skin.   Benzoin, steri-strips, and clean dressings were applied. The patient  was then extubated and brought to the recovery room in stable  condition. Instrument, sponge, and needle counts were correct at closure and at the conclusion of the case.   Findings: Cholecystitis with Cholelithiasis  Estimated Blood Loss: Minimal         Drains: 0         Specimens: Gallbladder           Complications: None; patient tolerated the procedure well.         Disposition: PACU - hemodynamically stable.         Condition: stable

## 2011-12-18 NOTE — Interval H&P Note (Signed)
History and Physical Interval Note: no change in H and P  12/18/2011 12:05 PM  Kathleen Cooper  has presented today for surgery, with the diagnosis of gallstones  The various methods of treatment have been discussed with the patient and family. After consideration of risks, benefits and other options for treatment, the patient has consented to  Procedure(s) (LRB) with comments: LAPAROSCOPIC CHOLECYSTECTOMY (N/A) INTRAOPERATIVE CHOLANGIOGRAM (N/A) as a surgical intervention .  The patient's history has been reviewed, patient examined, no change in status, stable for surgery.  I have reviewed the patient's chart and labs.  Questions were answered to the patient's satisfaction.     Shamiah Kahler A

## 2011-12-18 NOTE — Anesthesia Procedure Notes (Signed)
Procedure Name: Intubation Date/Time: 12/18/2011 1:25 PM Performed by: Lilyan Prete D Pre-anesthesia Checklist: Patient identified, Emergency Drugs available, Suction available and Patient being monitored Patient Re-evaluated:Patient Re-evaluated prior to inductionOxygen Delivery Method: Circle System Utilized Preoxygenation: Pre-oxygenation with 100% oxygen Intubation Type: IV induction Ventilation: Mask ventilation without difficulty Laryngoscope Size: Mac and 3 Grade View: Grade I Tube type: Oral Tube size: 7.0 mm Number of attempts: 1 Airway Equipment and Method: stylet and LTA kit utilized Placement Confirmation: ETT inserted through vocal cords under direct vision,  positive ETCO2 and breath sounds checked- equal and bilateral Secured at: 21 cm Tube secured with: Tape Dental Injury: Teeth and Oropharynx as per pre-operative assessment

## 2011-12-19 ENCOUNTER — Encounter (HOSPITAL_BASED_OUTPATIENT_CLINIC_OR_DEPARTMENT_OTHER): Payer: Self-pay | Admitting: Surgery

## 2011-12-29 ENCOUNTER — Encounter (INDEPENDENT_AMBULATORY_CARE_PROVIDER_SITE_OTHER): Payer: Self-pay | Admitting: Surgery

## 2011-12-29 ENCOUNTER — Ambulatory Visit (INDEPENDENT_AMBULATORY_CARE_PROVIDER_SITE_OTHER): Payer: Medicaid Other | Admitting: Surgery

## 2011-12-29 VITALS — BP 100/62 | HR 92 | Temp 97.1°F | Resp 16 | Ht 63.0 in | Wt 136.8 lb

## 2011-12-29 DIAGNOSIS — Z09 Encounter for follow-up examination after completed treatment for conditions other than malignant neoplasm: Secondary | ICD-10-CM

## 2011-12-29 NOTE — Progress Notes (Signed)
Subjective:     Patient ID: Kathleen Cooper, female   DOB: [birth year], 31 y.o.   MRN: [id number]  HPI She is here for her first postop visit status post laparoscopic cholecystectomy.  She feels well but does have some intermittent emesis. She has no pain and is moving her bowels well. Review of Systems     Objective:   Physical Exam Her abdomen is completely soft, nontender, nondistended   The final pathology showed cholelithiasis with mild chronic cholecystitis Assessment:      stable postop    Plan:     Hopefully she will continue to improve. If she continues to have problems she will call back and notify her gastroenterologist.

## 2012-06-15 DIAGNOSIS — E559 Vitamin D deficiency, unspecified: Secondary | ICD-10-CM

## 2012-06-15 HISTORY — DX: Vitamin D deficiency, unspecified: E55.9

## 2012-07-15 ENCOUNTER — Other Ambulatory Visit: Payer: Self-pay | Admitting: Gastroenterology

## 2012-07-15 DIAGNOSIS — R109 Unspecified abdominal pain: Secondary | ICD-10-CM

## 2012-07-22 ENCOUNTER — Ambulatory Visit
Admission: RE | Admit: 2012-07-22 | Discharge: 2012-07-22 | Disposition: A | Discharge status: Home / Self Care | Payer: Medicaid Other | Source: Ambulatory Visit | Attending: Gastroenterology | Admitting: Gastroenterology

## 2012-07-22 DIAGNOSIS — R109 Unspecified abdominal pain: Secondary | ICD-10-CM

## 2012-07-22 MED ORDER — IOHEXOL 300 MG/ML  SOLN: 100.0000 mL | Freq: Once | INTRAMUSCULAR | Status: AC | PRN

## 2012-08-26 ENCOUNTER — Other Ambulatory Visit: Payer: Self-pay | Admitting: Gastroenterology

## 2012-12-22 ENCOUNTER — Encounter (HOSPITAL_COMMUNITY): Payer: Self-pay | Admitting: Emergency Medicine

## 2012-12-22 ENCOUNTER — Emergency Department (HOSPITAL_COMMUNITY)
Admission: EM | Admit: 2012-12-22 | Discharge: 2012-12-22 | Disposition: A | Discharge status: Home / Self Care | Payer: Medicaid Other | Attending: Emergency Medicine | Admitting: Emergency Medicine

## 2012-12-22 DIAGNOSIS — F17200 Nicotine dependence, unspecified, uncomplicated: Secondary | ICD-10-CM | POA: Insufficient documentation

## 2012-12-22 DIAGNOSIS — Z79899 Other long term (current) drug therapy: Secondary | ICD-10-CM | POA: Insufficient documentation

## 2012-12-22 DIAGNOSIS — R112 Nausea with vomiting, unspecified: Secondary | ICD-10-CM | POA: Insufficient documentation

## 2012-12-22 DIAGNOSIS — R071 Chest pain on breathing: Secondary | ICD-10-CM | POA: Insufficient documentation

## 2012-12-22 DIAGNOSIS — R0789 Other chest pain: Secondary | ICD-10-CM

## 2012-12-22 DIAGNOSIS — Z8669 Personal history of other diseases of the nervous system and sense organs: Secondary | ICD-10-CM | POA: Insufficient documentation

## 2012-12-22 DIAGNOSIS — R109 Unspecified abdominal pain: Secondary | ICD-10-CM | POA: Insufficient documentation

## 2012-12-22 DIAGNOSIS — Z98811 Dental restoration status: Secondary | ICD-10-CM | POA: Insufficient documentation

## 2012-12-22 DIAGNOSIS — K5900 Constipation, unspecified: Secondary | ICD-10-CM | POA: Insufficient documentation

## 2012-12-22 MED ORDER — NAPROXEN 500 MG PO TABS: 500.0000 mg | ORAL_TABLET | Freq: Two times a day (BID) | ORAL | Status: DC

## 2012-12-22 NOTE — ED Provider Notes (Signed)
CSN: [id number]     Arrival date & time 12/22/12  1616 History   First MD Initiated Contact with Patient 12/22/12 1707     Chief Complaint  Patient presents with  . Chest Pain  . Abdominal Pain   ) HPI  Patient presents with left upper lateral chest pain. It hurts to touch right against her sternum. She was concerned because she waking this morning and had some nausea and one episode of vomiting. She had normal day 2 the rest of the day today eating and drinking well.Painful to touch, move, or breathe for the last 2 weeks. Even hurts to roll over at night.  Past Medical History  Diagnosis Date  . Tachycardia     hx. - had workup 09/2010  . GERD (gastroesophageal reflux disease)   . Constipation 12/12/2011    chronic  . Venous angioma of brain     states has no problems  . Gallstones 12/2011  . Dental crowns present   . Complication of anesthesia     states has been hard to wake up post-op   Past Surgical History  Procedure Laterality Date  . Laparoscopy  2006  . Cesarean section  12/19/2010    Procedure: CESAREAN SECTION;  Surgeon: Oliver Pila;  Location: [facility];  Service: Gynecology;;  Primary cesarean section.   . Cholecystectomy  12/18/2011    Procedure: LAPAROSCOPIC CHOLECYSTECTOMY;  Surgeon: Shelly Rubenstein, MD;  Location: [facility];  Service: General;  Laterality: N/A;   Family History  Problem Relation Age of Onset  . Hyperlipidemia Mother 18    alive  . Diabetes Father   . Coronary artery disease Father 69    alive   History  Substance Use Topics  . Smoking status: Current Some Day Smoker -- 5 years  . Smokeless tobacco: Never Used     Comment: smokes hookah occ.  Marland Kitchen Alcohol Use: No   OB History   Grav Para Term Preterm Abortions TAB SAB Ect Mult Living   3 3 3  0 0 0 0 0 0 3     Review of Systems  Constitutional: Negative for fever, chills, diaphoresis, appetite change and fatigue.  HENT: Negative for mouth sores, sore throat and  trouble swallowing.   Eyes: Negative for visual disturbance.  Respiratory: Negative for cough, chest tightness, shortness of breath and wheezing.   Cardiovascular: Positive for chest pain.  Gastrointestinal: Negative for nausea, vomiting, abdominal pain, diarrhea and abdominal distention.  Endocrine: Negative for polydipsia, polyphagia and polyuria.  Genitourinary: Negative for dysuria, frequency and hematuria.  Musculoskeletal: Negative for gait problem.  Skin: Negative for color change, pallor and rash.  Neurological: Negative for dizziness, syncope, light-headedness and headaches.  Hematological: Does not bruise/bleed easily.  Psychiatric/Behavioral: Negative for behavioral problems and confusion.    Allergies  Review of patient's allergies indicates no known allergies.  Home Medications   Current Outpatient Rx  Name  Route  Sig  Dispense  Refill  . bisacodyl (DULCOLAX) 5 MG EC tablet   Oral   Take 5 mg by mouth daily as needed for moderate constipation.          Marland Kitchen ibuprofen (ADVIL,MOTRIN) 800 MG tablet   Oral   Take 800 mg by mouth every 8 (eight) hours as needed for moderate pain.         . naproxen (NAPROSYN) 500 MG tablet   Oral   Take 1 tablet (500 mg total) by mouth 2 (two) times  daily.   30 tablet   0    BP 115/73  Pulse 97  Temp(Src) 97.7 F (36.5 C) (Oral)  Resp 16  SpO2 100%  LMP 12/18/2012 Physical Exam  Constitutional: She is oriented to person, place, and time. She appears well-developed and well-nourished. No distress.  HENT:  Head: Normocephalic.  Eyes: Conjunctivae are normal. Pupils are equal, round, and reactive to light. No scleral icterus.  Neck: Normal range of motion. Neck supple. No thyromegaly present.  Cardiovascular: Normal rate and regular rhythm.  Exam reveals no gallop and no friction rub.   No murmur heard. Pulmonary/Chest: Effort normal and breath sounds normal. No respiratory distress. She has no wheezes. She has no rales.     Clear lungs. No rash across the chest wall. No crackles or rales. Normal sats. Sinus rhythm.  Abdominal: Soft. Bowel sounds are normal. She exhibits no distension. There is no tenderness. There is no rebound.  Musculoskeletal: Normal range of motion.  Neurological: She is alert and oriented to person, place, and time.  Skin: Skin is warm and dry. No rash noted.  Psychiatric: She has a normal mood and affect. Her behavior is normal.    ED Course  Procedures (including critical care time) Labs Review Labs Reviewed - No data to display Imaging Review No results found.  EKG Interpretation   None       MDM   1. Chest wall pain    Her pain is easily reproducible. She is a normal EKG. She is 34. She is a nonsmoker. She has no pulmonary complaints. She has a benign abdomen. She is perfectly appropriate for outpatient treatment with anti-inflammatories for chest wall pain.    Roney Marion, MD 12/22/12 [phone]

## 2012-12-22 NOTE — ED Notes (Addendum)
Pt states that she has been having CP, epigastric pain, nausea, vomiting.  States that the pain radiates to her back.  Pt w/ hx of gallstones and GERD.  States that the pain feels like a burning feeling and that it wakes her up in the middle of the night.

## 2013-05-31 ENCOUNTER — Ambulatory Visit (INDEPENDENT_AMBULATORY_CARE_PROVIDER_SITE_OTHER): Payer: Medicaid Other | Admitting: *Deleted

## 2013-05-31 DIAGNOSIS — R002 Palpitations: Secondary | ICD-10-CM

## 2013-05-31 DIAGNOSIS — R079 Chest pain, unspecified: Secondary | ICD-10-CM

## 2013-05-31 NOTE — Patient Instructions (Signed)
Cardiac Event Monitoring A cardiac event monitor is a small recording device used to help detect abnormal heart rhythms (arrhythmias). The monitor is used to record heart rhythm when noticeable symptoms such as the following occur:  Fast heart beats (palpitations), such as heart racing or fluttering.  Dizziness.  Fainting or lightheadedness.  Unexplained weakness. The monitor is wired to two electrodes placed on your chest. Electrodes are flat, sticky disks that attach to your skin. The monitor can be worn for up to 30 days. You will wear the monitor at all times, except when bathing.  HOW TO USE YOUR CARDIAC EVENT MONITOR A technician will prepare your chest for the electrode placement. The technician will show you how to place the electrodes, how to work the monitor, and how to replace the batteries. Take time to practice using the monitor before you leave the office. Make sure you understand how to send the information from the monitor to your health care provider. This requires a telephone with a landline, not a cellphone. You need to:  Wear your monitor at all times, except when you are in water:  Do not get the monitor wet.  Take the monitor off when bathing. Do not swim or use a hot tub with it on.  Keep your skin clean. Do not put body lotion or moisturizer on your chest.  Change the electrodes daily or any time they stop sticking to your skin. You might need to use tape to keep them on.  It is possible that your skin under the electrodes could become irritated. To keep this from happening, try to put the electrodes in slightly different places on your chest. However, they must remain in the area under your left breast and in the upper right section of your chest.  Make sure the monitor is safely clipped to your clothing or in a location close to your body that your health care provider recommends.  Press the button to record when you feel symptoms of heart trouble, such as  dizziness, weakness, lightheadedness, palpitations, thumping, shortness of breath, unexplained weakness, or a fluttering or racing heart. The monitor is always on and records what happened slightly before you pressed the button, so do not worry about being too late to get good information.  Keep a diary of your activities, such as walking, doing chores, and taking medicine. It is especially important to note what you were doing when you pushed the button to record your symptoms. This will help your health care provider determine what might be contributing to your symptoms. The information stored in your monitor will be reviewed by your health care provider alongside your diary entries.  Send the recorded information as recommended by your health care provider. It is important to understand that it will take some time for your health care provider to process the results.  Change the batteries as recommended by your health care provider. SEEK IMMEDIATE MEDICAL CARE IF:   You have chest pain.  You have extreme difficulty breathing or shortness of breath.  You develop a very fast heartbeat that persists.  You develop dizziness that does not go away .  You faint or constantly feel you are about to faint. Document Released: 10/02/2007 Document Revised: 08/25/2012 Document Reviewed: 06/21/2012 [facility] Patient Information 2014 [place], [place].

## 2013-05-31 NOTE — Progress Notes (Signed)
PATIENT ARRIVED TO HAVE MONITOR TO PLACED.   WRITTEN ORDER RECEIVED FOR 7 DAY EVENT MONITOR FROM DR Colletta [facility] ([place] PHONE [phone] ; FAX [phone] Dx PALPITATIONS AND CHEST PAIN.  RN ENROLLED PATIENT INTO CARDIONET. INSTRUCTION AND MONITOR GIVEN TO PATIENT.RN INFORMED PATIENT TO DOCUMENT ANYTHING SHE FEELS DEALING WITH HER HEART.SHE IS AWARE TO MAIL THE MONITOR BACK TOCARDIONET. SHE VERBALIZED UNDERSTANDING.     KIT [id number];PJ [id number]

## 2013-06-15 ENCOUNTER — Other Ambulatory Visit: Payer: Self-pay

## 2013-06-15 DIAGNOSIS — R002 Palpitations: Secondary | ICD-10-CM

## 2013-06-15 DIAGNOSIS — R079 Chest pain, unspecified: Secondary | ICD-10-CM

## 2013-06-20 ENCOUNTER — Encounter: Payer: Self-pay | Admitting: Cardiovascular Disease

## 2013-06-27 DIAGNOSIS — R768 Other specified abnormal immunological findings in serum: Secondary | ICD-10-CM

## 2013-06-27 DIAGNOSIS — M130 Polyarthritis, unspecified: Secondary | ICD-10-CM | POA: Insufficient documentation

## 2013-06-27 DIAGNOSIS — M797 Fibromyalgia: Secondary | ICD-10-CM | POA: Insufficient documentation

## 2013-06-27 DIAGNOSIS — M791 Myalgia, unspecified site: Secondary | ICD-10-CM

## 2013-06-27 HISTORY — DX: Fibromyalgia: M79.7

## 2013-06-27 HISTORY — DX: Other specified abnormal immunological findings in serum: R76.8

## 2013-06-27 HISTORY — DX: Myalgia, unspecified site: M79.10

## 2013-11-07 ENCOUNTER — Encounter (HOSPITAL_COMMUNITY): Payer: Self-pay | Admitting: Emergency Medicine

## 2014-05-12 ENCOUNTER — Encounter (HOSPITAL_COMMUNITY): Payer: Self-pay | Admitting: Emergency Medicine

## 2014-05-12 ENCOUNTER — Emergency Department (HOSPITAL_COMMUNITY)
Admission: EM | Admit: 2014-05-12 | Discharge: 2014-05-12 | Disposition: A | Discharge status: Home / Self Care | Payer: Medicaid Other | Attending: Emergency Medicine | Admitting: Emergency Medicine

## 2014-05-12 DIAGNOSIS — R5383 Other fatigue: Secondary | ICD-10-CM | POA: Diagnosis present

## 2014-05-12 DIAGNOSIS — F909 Attention-deficit hyperactivity disorder, unspecified type: Secondary | ICD-10-CM | POA: Insufficient documentation

## 2014-05-12 DIAGNOSIS — Z720 Tobacco use: Secondary | ICD-10-CM | POA: Insufficient documentation

## 2014-05-12 DIAGNOSIS — R202 Paresthesia of skin: Secondary | ICD-10-CM | POA: Diagnosis not present

## 2014-05-12 DIAGNOSIS — Z79899 Other long term (current) drug therapy: Secondary | ICD-10-CM | POA: Insufficient documentation

## 2014-05-12 DIAGNOSIS — Z86011 Personal history of benign neoplasm of the brain: Secondary | ICD-10-CM | POA: Insufficient documentation

## 2014-05-12 DIAGNOSIS — R200 Anesthesia of skin: Secondary | ICD-10-CM

## 2014-05-12 DIAGNOSIS — K5900 Constipation, unspecified: Secondary | ICD-10-CM | POA: Insufficient documentation

## 2014-05-12 HISTORY — DX: Attention-deficit hyperactivity disorder, unspecified type: F90.9

## 2014-05-12 LAB — BASIC METABOLIC PANEL
ANION GAP: 7 (ref 5–15)
BUN: 8 mg/dL (ref 6–20)
CALCIUM: 9.4 mg/dL (ref 8.9–10.3)
CO2: 27 mmol/L (ref 22–32)
CREATININE: 0.63 mg/dL (ref 0.44–1.00)
Chloride: 105 mmol/L (ref 101–111)
Glucose, Bld: 88 mg/dL (ref 70–99)
Potassium: 3.8 mmol/L (ref 3.5–5.1)
SODIUM: 139 mmol/L (ref 135–145)

## 2014-05-12 LAB — CBC
HEMATOCRIT: 40.7 % (ref 36.0–46.0)
HEMOGLOBIN: 13.6 g/dL (ref 12.0–15.0)
MCH: 30.2 pg (ref 26.0–34.0)
MCHC: 33.4 g/dL (ref 30.0–36.0)
MCV: 90.2 fL (ref 78.0–100.0)
Platelets: 281 10*3/uL (ref 150–400)
RBC: 4.51 MIL/uL (ref 3.87–5.11)
RDW: 12 % (ref 11.5–15.5)
WBC: 7.8 10*3/uL (ref 4.0–10.5)

## 2014-05-12 MED ORDER — NAPROXEN 500 MG PO TABS: 500.0000 mg | ORAL_TABLET | Freq: Two times a day (BID) | ORAL | Status: DC

## 2014-05-12 NOTE — ED Notes (Signed)
Delay on lab draw pt went to move car.

## 2014-05-12 NOTE — Discharge Instructions (Signed)
Neuropathic Pain Take naproxen for pain. Follow up with your provider.  We often think that pain has a physical cause. If we get rid of the cause, the pain should go away. Nerves themselves can also cause pain. It is called neuropathic pain, which means nerve abnormality. It may be difficult for the patients who have it and for the treating caregivers. Pain is usually described as acute (short-lived) or chronic (long-lasting). Acute pain is related to the physical sensations caused by an injury. It can last from a few seconds to many weeks, but it usually goes away when normal healing occurs. Chronic pain lasts beyond the typical healing time. With neuropathic pain, the nerve fibers themselves may be damaged or injured. They then send incorrect signals to other pain centers. The pain you feel is real, but the cause is not easy to find.  CAUSES  Chronic pain can result from diseases, such as diabetes and shingles (an infection related to chickenpox), or from trauma, surgery, or amputation. It can also happen without any known injury or disease. The nerves are sending pain messages, even though there is no identifiable cause for such messages.   Other common causes of neuropathy include diabetes, phantom limb pain, or Regional Pain Syndrome (RPS).  As with all forms of chronic back pain, if neuropathy is not correctly treated, there can be a number of associated problems that lead to a downward cycle for the patient. These include depression, sleeplessness, feelings of fear and anxiety, limited social interaction and inability to do normal daily activities or work.  The most dramatic and mysterious example of neuropathic pain is called "phantom limb syndrome." This occurs when an arm or a leg has been removed because of illness or injury. The brain still gets pain messages from the nerves that originally carried impulses from the missing limb. These nerves now seem to misfire and cause troubling  pain.  Neuropathic pain often seems to have no cause. It responds poorly to standard pain treatment. Neuropathic pain can occur after:  Shingles (herpes zoster virus infection).  A lasting burning sensation of the skin, caused usually by injury to a peripheral nerve.  Peripheral neuropathy which is widespread nerve damage, often caused by diabetes or alcoholism.  Phantom limb pain following an amputation.  Facial nerve problems (trigeminal neuralgia).  Multiple sclerosis.  Reflex sympathetic dystrophy.  Pain which comes with cancer and cancer chemotherapy.  Entrapment neuropathy such as when pressure is put on a nerve such as in carpal tunnel syndrome.  Back, leg, and hip problems (sciatica).  Spine or back surgery.  HIV Infection or AIDS where nerves are infected by viruses. Your caregiver can explain items in the above list which may apply to you. SYMPTOMS  Characteristics of neuropathic pain are:  Severe, sharp, electric shock-like, shooting, lightening-like, knife-like.  Pins and needles sensation.  Deep burning, deep cold, or deep ache.  Persistent numbness, tingling, or weakness.  Pain resulting from light touch or other stimulus that would not usually cause pain.  Increased sensitivity to something that would normally cause pain, such as a pinprick. Pain may persist for months or years following the healing of damaged tissues. When this happens, pain signals no longer sound an alarm about current injuries or injuries about to happen. Instead, the alarm system itself is not working correctly.  Neuropathic pain may get worse instead of better over time. For some people, it can lead to serious disability. It is important to be aware that severe injury  in a limb can occur without a proper, protective pain response.Burns, cuts, and other injuries may go unnoticed. Without proper treatment, these injuries can become infected or lead to further disability. Take any injury  seriously, and consult your caregiver for treatment. DIAGNOSIS  When you have a pain with no known cause, your caregiver will probably ask some specific questions:   Do you have any other conditions, such as diabetes, shingles, multiple sclerosis, or HIV infection?  How would you describe your pain? (Neuropathic pain is often described as shooting, stabbing, burning, or searing.)  Is your pain worse at any time of the day? (Neuropathic pain is usually worse at night.)  Does the pain seem to follow a certain physical pathway?  Does the pain come from an area that has missing or injured nerves? (An example would be phantom limb pain.)  Is the pain triggered by minor things such as rubbing against the sheets at night? These questions often help define the type of pain involved. Once your caregiver knows what is happening, treatment can begin. Anticonvulsant, antidepressant drugs, and various pain relievers seem to work in some cases. If another condition, such as diabetes is involved, better management of that disorder may relieve the neuropathic pain.  TREATMENT  Neuropathic pain is frequently long-lasting and tends not to respond to treatment with narcotic type pain medication. It may respond well to other drugs such as antiseizure and antidepressant medications. Usually, neuropathic problems do not completely go away, but partial improvement is often possible with proper treatment. Your caregivers have large numbers of medications available to treat you. Do not be discouraged if you do not get immediate relief. Sometimes different medications or a combination of medications will be tried before you receive the results you are hoping for. See your caregiver if you have pain that seems to be coming from nowhere and does not go away. Help is available.  SEEK IMMEDIATE MEDICAL CARE IF:   There is a sudden change in the quality of your pain, especially if the change is on only one side of the  body.  You notice changes of the skin, such as redness, black or purple discoloration, swelling, or an ulcer.  You cannot move the affected limbs. Document Released: 09/20/2003 Document Revised: 03/17/2011 Document Reviewed: 09/20/2003 [facility] Patient Information 2015 [place], [place]. This information is not intended to replace advice given to you by your health care provider. Make sure you discuss any questions you have with your health care provider.

## 2014-05-12 NOTE — ED Provider Notes (Signed)
CSN: [id number]     Arrival date & time 05/12/14  1438 History   First MD Initiated Contact with Patient 05/12/14 1633     Chief Complaint  Patient presents with  . Arm Problem  . Fatigue     (Consider location/radiation/quality/duration/timing/severity/associated sxs/prior Treatment) HPI Ms. Quijas is a 34 y.o female with a history of GERD, constipation, gallstones and ADHD who presents with multiple complaints.  She states that she has left arm numbness, weakness, and a bluish color to the left palm. She is also complaining of easy bruising and states she woke up with a bruise on her right forearm.  She also feels like someone is choking her at night and that her throat is closing when she sleeps. She has had these symptoms for several weeks and has been seen by her pcp.  She was not happy with her care there and is asking for physician recommendations. No history of injury. She denies any fever, chest pain, shortness of breath, abdominal pain, vomiting, diarrhea, or leg pain.  Past Medical History  Diagnosis Date  . Tachycardia     hx. - had workup 09/2010  . GERD (gastroesophageal reflux disease)   . Constipation 12/12/2011    chronic  . Venous angioma of brain     states has no problems  . Gallstones 12/2011  . Dental crowns present   . Complication of anesthesia     states has been hard to wake up post-op  . ADHD (attention deficit hyperactivity disorder)    Past Surgical History  Procedure Laterality Date  . Laparoscopy  2006  . Cesarean section  12/19/2010    Procedure: CESAREAN SECTION;  Surgeon: Logan Bores;  Location: [facility];  Service: Gynecology;;  Primary cesarean section.   . Cholecystectomy  12/18/2011    Procedure: LAPAROSCOPIC CHOLECYSTECTOMY;  Surgeon: Harl Bowie, MD;  Location: [facility];  Service: General;  Laterality: N/A;   Family History  Problem Relation Age of Onset  . Hyperlipidemia Mother 68    alive  . Diabetes Father   .  Coronary artery disease Father 60    alive   History  Substance Use Topics  . Smoking status: Current Some Day Smoker -- 5 years  . Smokeless tobacco: Never Used     Comment: smokes hookah occ.  Marland Kitchen Alcohol Use: No   OB History    Gravida Para Term Preterm AB TAB SAB Ectopic Multiple Living   3 3 3  0 0 0 0 0 0 3     Review of Systems  Constitutional: Negative for fever.  HENT: Negative for sore throat.   Musculoskeletal: Negative for neck stiffness.  Skin: Negative for rash.  Neurological: Negative for headaches.  Hematological: Bruises/bleeds easily.      Allergies  Review of patient's allergies indicates no known allergies.  Home Medications   Prior to Admission medications   Medication Sig Start Date End Date Taking? Authorizing Provider  Bisacodyl (WOMENS LAXATIVE PO) Take 2 tablets by mouth daily as needed (constipation).   Yes Historical Provider, MD  ibuprofen (ADVIL,MOTRIN) 800 MG tablet Take 800 mg by mouth every 8 (eight) hours as needed for moderate pain.   Yes Historical Provider, MD  MELATONIN PO Take 5 mg by mouth at bedtime as needed.   Yes Historical Provider, MD  methylphenidate 54 MG PO CR tablet Take 54 mg by mouth every morning.   Yes Historical Provider, MD  bisacodyl (DULCOLAX) 5 MG EC tablet  Take 5 mg by mouth daily as needed for moderate constipation.     Historical Provider, MD  naproxen (NAPROSYN) 500 MG tablet Take 1 tablet (500 mg total) by mouth 2 (two) times daily. 05/12/14   Ramesh Moan Patel-Mills, PA-C   BP 101/65 mmHg  Pulse 92  Temp(Src) 98 F (36.7 C) (Oral)  Resp 18  SpO2 100% Physical Exam  Constitutional: She is oriented to person, place, and time. She appears well-developed and well-nourished.  HENT:  Head: Normocephalic and atraumatic.  Mouth/Throat: Uvula is midline, oropharynx is clear and moist and mucous membranes are normal. No uvula swelling. No oropharyngeal exudate or tonsillar abscesses.  Neck: Normal range of motion. Neck  supple. Carotid bruit is not present.  No cervical lymphadenopathy. No goiter or palpable anterior neck mass or nodule.  No midline cervical tenderness.  Left paravertebral tenderness to palpation.   Cardiovascular: Normal rate, regular rhythm and normal heart sounds.   Pulmonary/Chest: Effort normal and breath sounds normal.  Musculoskeletal: Normal range of motion.  Neurological: She is alert and oriented to person, place, and time. She has normal strength. No cranial nerve deficit or sensory deficit.  Cranial nerves III-XII in tact. No left upper extremity weakness.  Good grip strength. Cap refill in bilateral upper extremities is <2 seconds. Good radial pulses bilaterally. No pallor to the left hand.   Skin: Skin is warm and dry.    ED Course  Procedures (including critical care time) Labs Review Labs Reviewed  BASIC METABOLIC PANEL  CBC    Imaging Review No results found.   EKG Interpretation   Date/Time:  Friday May 12 2014 14:53:40 EDT Ventricular Rate:  83 PR Interval:  118 QRS Duration: 82 QT Interval:  379 QTC Calculation: 445 R Axis:   48 Text Interpretation:  Sinus rhythm Borderline short PR interval Consider  right atrial enlargement Baseline wander in lead(s) V1 Confirmed by ZAMMIT   MD, JOSEPH [phone]) on 05/12/2014 4:51:35 PM      MDM   Final diagnoses:  Numbness and tingling in left arm   Patient presents for multiple complaints that have been going on for several weeks which include left arm tingling, numbness and bluish color.  She also complains of easy bruising and a feeling that her throat is swelling up at night. She is concerned she has a blood clot.   She is not tachycardic here and does not have shortness of breath.  She is not on birth control and had no recent travel. She is PERC negative. Her EKG is normal and troponin is negative.  I think her left arm pain is related to her neck pain and possibly a nerve pain.  She was evaluated by her pcp  who told her that it was muscle related.  I have no explanation for her easy bruising and her platelets are normal.  I think she can follow up with her pcp and I have given her the resource guide to find a new provider.      Ottie Glazier, PA-C 05/13/14 [id number]  Milton Ferguson, MD 05/13/14 [phone]

## 2014-05-12 NOTE — ED Notes (Signed)
Pt reports yesterday she had gradual onset weakness in her L arm. Hx of similar problem before. Symptoms improved since yesterday. No arm drift in triage. Pt reports hands feel more numb than usual, but able to feel. Also having hand and foot coldness. Also has a bruise on R forearm that has strunk since yesterday. Otherwise having generalized weakness and HA.

## 2014-05-30 DIAGNOSIS — F909 Attention-deficit hyperactivity disorder, unspecified type: Secondary | ICD-10-CM | POA: Insufficient documentation

## 2014-05-30 DIAGNOSIS — H532 Diplopia: Secondary | ICD-10-CM

## 2014-05-30 DIAGNOSIS — D1802 Hemangioma of intracranial structures: Secondary | ICD-10-CM | POA: Insufficient documentation

## 2014-05-30 HISTORY — DX: Diplopia: H53.2

## 2014-07-08 ENCOUNTER — Emergency Department (HOSPITAL_COMMUNITY)
Admission: EM | Admit: 2014-07-08 | Discharge: 2014-07-08 | Disposition: A | Discharge status: Home / Self Care | Payer: Medicaid Other | Attending: Emergency Medicine | Admitting: Emergency Medicine

## 2014-07-08 ENCOUNTER — Encounter (HOSPITAL_COMMUNITY): Payer: Self-pay | Admitting: Emergency Medicine

## 2014-07-08 ENCOUNTER — Emergency Department (HOSPITAL_COMMUNITY): Payer: Medicaid Other

## 2014-07-08 DIAGNOSIS — K5900 Constipation, unspecified: Secondary | ICD-10-CM | POA: Diagnosis not present

## 2014-07-08 DIAGNOSIS — Z791 Long term (current) use of non-steroidal anti-inflammatories (NSAID): Secondary | ICD-10-CM | POA: Diagnosis not present

## 2014-07-08 DIAGNOSIS — M779 Enthesopathy, unspecified: Secondary | ICD-10-CM

## 2014-07-08 DIAGNOSIS — Z86018 Personal history of other benign neoplasm: Secondary | ICD-10-CM | POA: Insufficient documentation

## 2014-07-08 DIAGNOSIS — M25539 Pain in unspecified wrist: Secondary | ICD-10-CM | POA: Diagnosis present

## 2014-07-08 DIAGNOSIS — M778 Other enthesopathies, not elsewhere classified: Secondary | ICD-10-CM | POA: Insufficient documentation

## 2014-07-08 DIAGNOSIS — Z720 Tobacco use: Secondary | ICD-10-CM | POA: Insufficient documentation

## 2014-07-08 DIAGNOSIS — F909 Attention-deficit hyperactivity disorder, unspecified type: Secondary | ICD-10-CM | POA: Diagnosis not present

## 2014-07-08 DIAGNOSIS — Z98811 Dental restoration status: Secondary | ICD-10-CM | POA: Diagnosis not present

## 2014-07-08 MED ORDER — IBUPROFEN 600 MG PO TABS: 600.0000 mg | ORAL_TABLET | Freq: Three times a day (TID) | ORAL | Status: DC

## 2014-07-08 NOTE — ED Notes (Signed)
Pt reports that she was doing housework yesterday and started to feel pain with swelling in R wrist. Pt denies fall or trauma, just moving furniture. Pt is A&O and in NAD

## 2014-07-08 NOTE — Discharge Instructions (Signed)

## 2014-07-08 NOTE — ED Provider Notes (Signed)
CSN: [id number]     Arrival date & time 07/08/14  1128 History   First MD Initiated Contact with Patient 07/08/14 1133     Chief Complaint  Patient presents with  . Wrist Pain     (Consider location/radiation/quality/duration/timing/severity/associated sxs/prior Treatment) HPI Comments: States that it happened after house cleaning yesterday. Denies numbness or weakness  Patient is a 34 y.o. female presenting with wrist pain. The history is provided by the patient. No language interpreter was used.  Wrist Pain This is a new problem. The current episode started yesterday. The problem occurs constantly. The problem has been unchanged. The symptoms are aggravated by bending. She has tried nothing for the symptoms.    Past Medical History  Diagnosis Date  . Tachycardia     hx. - had workup 09/2010  . GERD (gastroesophageal reflux disease)   . Constipation 12/12/2011    chronic  . Venous angioma of brain     states has no problems  . Gallstones 12/2011  . Dental crowns present   . Complication of anesthesia     states has been hard to wake up post-op  . ADHD (attention deficit hyperactivity disorder)    Past Surgical History  Procedure Laterality Date  . Laparoscopy  2006  . Cesarean section  12/19/2010    Procedure: CESAREAN SECTION;  Surgeon: Logan Bores;  Location: [facility];  Service: Gynecology;;  Primary cesarean section.   . Cholecystectomy  12/18/2011    Procedure: LAPAROSCOPIC CHOLECYSTECTOMY;  Surgeon: Harl Bowie, MD;  Location: [place];  Service: General;  Laterality: N/A;   Family History  Problem Relation Age of Onset  . Hyperlipidemia Mother 36    alive  . Diabetes Father   . Coronary artery disease Father 30    alive   History  Substance Use Topics  . Smoking status: Current Some Day Smoker -- 5 years  . Smokeless tobacco: Never Used     Comment: smokes hookah occ.  Marland Kitchen Alcohol Use: No   OB History    Gravida Para Term Preterm  AB TAB SAB Ectopic Multiple Living   3 3 3  0 0 0 0 0 0 3     Review of Systems  All other systems reviewed and are negative.     Allergies  Review of patient's allergies indicates no known allergies.  Home Medications   Prior to Admission medications   Medication Sig Start Date End Date Taking? Authorizing Provider  bisacodyl (DULCOLAX) 5 MG EC tablet Take 5 mg by mouth daily as needed for moderate constipation.     Historical Provider, MD  Bisacodyl (WOMENS LAXATIVE PO) Take 2 tablets by mouth daily as needed (constipation).    Historical Provider, MD  ibuprofen (ADVIL,MOTRIN) 800 MG tablet Take 800 mg by mouth every 8 (eight) hours as needed for moderate pain.    Historical Provider, MD  MELATONIN PO Take 5 mg by mouth at bedtime as needed.    Historical Provider, MD  methylphenidate 54 MG PO CR tablet Take 54 mg by mouth every morning.    Historical Provider, MD  naproxen (NAPROSYN) 500 MG tablet Take 1 tablet (500 mg total) by mouth 2 (two) times daily. 05/12/14   Hanna Patel-Mills, PA-C   BP 114/73 mmHg  Pulse 96  Temp(Src) 98.3 F (36.8 C) (Oral)  Resp 20  SpO2 100%  LMP 07/06/2014 (Exact Date) Physical Exam  Constitutional: She is oriented to person, place, and time. She appears well-developed  and well-nourished.  Cardiovascular: Normal rate and regular rhythm.   Pulmonary/Chest: Effort normal.  Musculoskeletal:  Obvious swelling to the medial aspect of the right wrist. No gross deformity. Pulses intact. Grip strength equal  Neurological: She is alert and oriented to person, place, and time. Coordination normal.  Skin: Skin is warm and dry.  Nursing note and vitals reviewed.   ED Course  Procedures (including critical care time) Labs Review Labs Reviewed - No data to display  Imaging Review Dg Wrist Complete Right  07/08/2014   CLINICAL DATA:  Pain and swelling of the distal forearm after house work yesterday.  EXAM: RIGHT WRIST - COMPLETE 3+ VIEW  COMPARISON:   Radiographs dated 02/10/2010  FINDINGS: There is no evidence of fracture or dislocation. There is no evidence of arthropathy or other focal bone abnormality. Soft tissues are unremarkable.  IMPRESSION: Normal exam.   Electronically Signed   By: Lorriane Shire M.D.   On: 07/08/2014 12:17   Dg Hand Complete Right  07/08/2014   CLINICAL DATA:  Pain and swelling after house worked yesterday.  EXAM: RIGHT HAND - COMPLETE 3+ VIEW  COMPARISON:  Radiographs dated 02/10/2010  FINDINGS: There is no evidence of fracture or dislocation. There is no evidence of arthropathy or other focal bone abnormality. Soft tissues are unremarkable.  IMPRESSION: Normal exam, unchanged.   Electronically Signed   By: Lorriane Shire M.D.   On: 07/08/2014 12:17     EKG Interpretation None      MDM   Final diagnoses:  Tendonitis    No acute bony injury noted. Pt is neurologically intact. Pt is okay to follow up with hand as needed. Splinted with velcro splint.     Glendell Docker, NP 07/08/14 [place], MD 07/08/14 [phone]

## 2014-10-02 DIAGNOSIS — F411 Generalized anxiety disorder: Secondary | ICD-10-CM | POA: Insufficient documentation

## 2014-10-02 HISTORY — DX: Generalized anxiety disorder: F41.1

## 2014-10-24 ENCOUNTER — Ambulatory Visit (HOSPITAL_COMMUNITY): Payer: Medicaid Other | Admitting: Psychiatry

## 2014-12-18 ENCOUNTER — Emergency Department (HOSPITAL_COMMUNITY): Payer: Medicaid Other

## 2014-12-18 ENCOUNTER — Emergency Department (HOSPITAL_COMMUNITY)
Admission: EM | Admit: 2014-12-18 | Discharge: 2014-12-18 | Disposition: A | Discharge status: Home / Self Care | Payer: Medicaid Other | Attending: Emergency Medicine | Admitting: Emergency Medicine

## 2014-12-18 ENCOUNTER — Encounter (HOSPITAL_COMMUNITY): Payer: Self-pay | Admitting: Emergency Medicine

## 2014-12-18 DIAGNOSIS — K5900 Constipation, unspecified: Secondary | ICD-10-CM | POA: Insufficient documentation

## 2014-12-18 DIAGNOSIS — F17200 Nicotine dependence, unspecified, uncomplicated: Secondary | ICD-10-CM | POA: Insufficient documentation

## 2014-12-18 DIAGNOSIS — Z3202 Encounter for pregnancy test, result negative: Secondary | ICD-10-CM | POA: Insufficient documentation

## 2014-12-18 DIAGNOSIS — Z98811 Dental restoration status: Secondary | ICD-10-CM | POA: Insufficient documentation

## 2014-12-18 DIAGNOSIS — J020 Streptococcal pharyngitis: Secondary | ICD-10-CM | POA: Insufficient documentation

## 2014-12-18 DIAGNOSIS — Z791 Long term (current) use of non-steroidal anti-inflammatories (NSAID): Secondary | ICD-10-CM | POA: Insufficient documentation

## 2014-12-18 DIAGNOSIS — Z86018 Personal history of other benign neoplasm: Secondary | ICD-10-CM | POA: Insufficient documentation

## 2014-12-18 DIAGNOSIS — F909 Attention-deficit hyperactivity disorder, unspecified type: Secondary | ICD-10-CM | POA: Insufficient documentation

## 2014-12-18 DIAGNOSIS — Z79899 Other long term (current) drug therapy: Secondary | ICD-10-CM | POA: Insufficient documentation

## 2014-12-18 LAB — COMPREHENSIVE METABOLIC PANEL
ALT: 18 U/L (ref 14–54)
ANION GAP: 9 (ref 5–15)
AST: 20 U/L (ref 15–41)
Albumin: 4.2 g/dL (ref 3.5–5.0)
Alkaline Phosphatase: 70 U/L (ref 38–126)
BILIRUBIN TOTAL: 1.1 mg/dL (ref 0.3–1.2)
BUN: 8 mg/dL (ref 6–20)
CO2: 26 mmol/L (ref 22–32)
Calcium: 9.4 mg/dL (ref 8.9–10.3)
Chloride: 103 mmol/L (ref 101–111)
Creatinine, Ser: 0.77 mg/dL (ref 0.44–1.00)
Glucose, Bld: 95 mg/dL (ref 65–99)
POTASSIUM: 3.6 mmol/L (ref 3.5–5.1)
Sodium: 138 mmol/L (ref 135–145)
TOTAL PROTEIN: 7.6 g/dL (ref 6.5–8.1)

## 2014-12-18 LAB — CBC WITH DIFFERENTIAL/PLATELET
Basophils Absolute: 0 10*3/uL (ref 0.0–0.1)
Basophils Relative: 0 %
EOS PCT: 2 %
Eosinophils Absolute: 0.2 10*3/uL (ref 0.0–0.7)
HCT: 37 % (ref 36.0–46.0)
Hemoglobin: 12.6 g/dL (ref 12.0–15.0)
Lymphocytes Relative: 20 %
Lymphs Abs: 2.6 10*3/uL (ref 0.7–4.0)
MCH: 29.9 pg (ref 26.0–34.0)
MCHC: 34.1 g/dL (ref 30.0–36.0)
MCV: 87.7 fL (ref 78.0–100.0)
MONO ABS: 1.1 10*3/uL — AB (ref 0.1–1.0)
MONOS PCT: 8 %
NEUTROS ABS: 9 10*3/uL — AB (ref 1.7–7.7)
Neutrophils Relative %: 70 %
PLATELETS: 267 10*3/uL (ref 150–400)
RBC: 4.22 MIL/uL (ref 3.87–5.11)
RDW: 12 % (ref 11.5–15.5)
WBC: 12.9 10*3/uL — ABNORMAL HIGH (ref 4.0–10.5)

## 2014-12-18 LAB — RAPID STREP SCREEN (MED CTR MEBANE ONLY): STREPTOCOCCUS, GROUP A SCREEN (DIRECT): POSITIVE — AB

## 2014-12-18 LAB — I-STAT BETA HCG BLOOD, ED (MC, WL, AP ONLY): I-stat hCG, quantitative: <5 m[IU]/mL (ref <5)

## 2014-12-18 MED ORDER — PENICILLIN G BENZATHINE 1200000 UNIT/2ML IM SUSP
1.2000 10*6.[IU] | Freq: Once | INTRAMUSCULAR | Status: AC
  Administered 2014-12-18: 1.2 10*6.[IU] via INTRAMUSCULAR
  Filled 2014-12-18: qty 2

## 2014-12-18 MED ORDER — IBUPROFEN 800 MG PO TABS
800.0000 mg | ORAL_TABLET | Freq: Once | ORAL | Status: AC
  Administered 2014-12-18: 800 mg via ORAL
  Filled 2014-12-18: qty 1

## 2014-12-18 NOTE — Discharge Instructions (Signed)
Return for worsening symptoms, including persistent fevers, difficulty breathing, swelling in your throat or difficulty swallowing saliva, persistent symptoms after 1-2 days, or any other symptoms concerning to you.  Strep Throat Strep throat is a bacterial infection of the throat. Your health care provider may call the infection tonsillitis or pharyngitis, depending on whether there is swelling in the tonsils or at the back of the throat. Strep throat is most common during the cold months of the year in children who are 31-37 years of age, but it can happen during any season in people of any age. This infection is spread from person to person (contagious) through coughing, sneezing, or close contact. CAUSES Strep throat is caused by the bacteria called Streptococcus pyogenes. RISK FACTORS This condition is more likely to develop in:  People who spend time in crowded places where the infection can spread easily.  People who have close contact with someone who has strep throat. SYMPTOMS Symptoms of this condition include:  Fever or chills.   Redness, swelling, or pain in the tonsils or throat.  Pain or difficulty when swallowing.  White or yellow spots on the tonsils or throat.  Swollen, tender glands in the neck or under the jaw.  Red rash all over the body (rare). DIAGNOSIS This condition is diagnosed by performing a rapid strep test or by taking a swab of your throat (throat culture test). Results from a rapid strep test are usually ready in a few minutes, but throat culture test results are available after one or two days. TREATMENT This condition is treated with antibiotic medicine. HOME CARE INSTRUCTIONS Medicines  Take over-the-counter and prescription medicines only as told by your health care provider.  Take your antibiotic as told by your health care provider. Do not stop taking the antibiotic even if you start to feel better.  Have family members who also have a sore  throat or fever tested for strep throat. They may need antibiotics if they have the strep infection. Eating and Drinking  Do not share food, drinking cups, or personal items that could cause the infection to spread to other people.  If swallowing is difficult, try eating soft foods until your sore throat feels better.  Drink enough fluid to keep your urine clear or pale yellow. General Instructions  Gargle with a salt-water mixture 3-4 times per day or as needed. To make a salt-water mixture, completely dissolve -1 tsp of salt in 1 cup of warm water.  Make sure that all household members wash their hands well.  Get plenty of rest.  Stay home from school or work until you have been taking antibiotics for 24 hours.  Keep all follow-up visits as told by your health care provider. This is important. SEEK MEDICAL CARE IF:  The glands in your neck continue to get bigger.  You develop a rash, cough, or earache.  You cough up a thick liquid that is green, yellow-brown, or bloody.  You have pain or discomfort that does not get better with medicine.  Your problems seem to be getting worse rather than better.  You have a fever. SEEK IMMEDIATE MEDICAL CARE IF:  You have new symptoms, such as vomiting, severe headache, stiff or painful neck, chest pain, or shortness of breath.  You have severe throat pain, drooling, or changes in your voice.  You have swelling of the neck, or the skin on the neck becomes red and tender.  You have signs of dehydration, such as fatigue, dry mouth,  and decreased urination.  You become increasingly sleepy, or you cannot wake up completely.  Your joints become red or painful.   This information is not intended to replace advice given to you by your health care provider. Make sure you discuss any questions you have with your health care provider.   Document Released: 12/21/1999 Document Revised: 09/13/2014 Document Reviewed: 04/17/2014 Elsevier  Interactive Patient Education [employer].

## 2014-12-18 NOTE — ED Notes (Addendum)
Pt c/o body aches, cough that is productive, nasal congestion x 2 weeks. [pt has ben trying to treat at home with pain relievers, honey and other OTC. Pt adds, sore throat and hard to swallow. Pt states that she has two kids at home that were positive for strep throat today.

## 2014-12-18 NOTE — ED Notes (Signed)
Pt ambulating independently w/ steady gait on d/c in no acute distress, A&Ox4. D/c instructions reviewed w/ pt - pt denies any further questions or concerns at present.  

## 2014-12-18 NOTE — ED Provider Notes (Signed)
CSN: [id number]     Arrival date & time 12/18/14  1748 History   First MD Initiated Contact with Patient 12/18/14 2134     Chief Complaint  Patient presents with  . Cough  . Generalized Body Aches  . Nasal Congestion     (Consider location/radiation/quality/duration/timing/severity/associated sxs/prior Treatment) HPI 34 year old female who presents with sore throat, cough, and generalized body aches. She has a history of tachycardia, with prior work-up and GERD. States that she initially had symptoms about 2 weeks ago, that has been progressively worsening. Her sons has had a similar illness and she has been sick, and recently tested positive for strep pharyngitis today. She has had subjective fevers and chills with intermittent nausea. She has also had cough productive of greenish yellow sputum. No difficulty breathing or chest pain. No abdominal pain diarrhea or urinary complaints.  Past Medical History  Diagnosis Date  . Tachycardia     hx. - had workup 09/2010  . GERD (gastroesophageal reflux disease)   . Constipation 12/12/2011    chronic  . Venous angioma of brain [facility])     states has no problems  . Gallstones 12/2011  . Dental crowns present   . Complication of anesthesia     states has been hard to wake up post-op  . ADHD (attention deficit hyperactivity disorder)    Past Surgical History  Procedure Laterality Date  . Laparoscopy  2006  . Cesarean section  12/19/2010    Procedure: CESAREAN SECTION;  Surgeon: Logan Bores;  Location: [facility];  Service: Gynecology;;  Primary cesarean section.   . Cholecystectomy  12/18/2011    Procedure: LAPAROSCOPIC CHOLECYSTECTOMY;  Surgeon: Harl Bowie, MD;  Location: [facility];  Service: General;  Laterality: N/A;   Family History  Problem Relation Age of Onset  . Hyperlipidemia Mother 16    alive  . Diabetes Father   . Coronary artery disease Father 103    alive   Social History  Substance Use Topics   . Smoking status: Current Some Day Smoker -- 5 years  . Smokeless tobacco: Never Used     Comment: smokes hookah occ.  Marland Kitchen Alcohol Use: No   OB History    Gravida Para Term Preterm AB TAB SAB Ectopic Multiple Living   3 3 3  0 0 0 0 0 0 3     Review of Systems 10/14 systems reviewed and are negative other than those stated in the HPI    Allergies  Review of patient's allergies indicates no known allergies.  Home Medications   Prior to Admission medications   Medication Sig Start Date End Date Taking? Authorizing Provider  bisacodyl (DULCOLAX) 5 MG EC tablet Take 5 mg by mouth daily as needed for moderate constipation.    Yes Historical Provider, MD  ibuprofen (ADVIL,MOTRIN) 200 MG tablet Take 800 mg by mouth every 6 (six) hours as needed for fever, mild pain or moderate pain.   Yes Historical Provider, MD  methylphenidate 54 MG PO CR tablet Take 54 mg by mouth every morning.   Yes Historical Provider, MD  naproxen (NAPROSYN) 500 MG tablet Take 1 tablet (500 mg total) by mouth 2 (two) times daily. 05/12/14  Yes Hanna Patel-Mills, PA-C   BP 120/78 mmHg  Pulse 95  Temp(Src) 98.6 F (37 C) (Oral)  Resp 22  SpO2 100%  LMP 12/08/2014 Physical Exam Physical Exam  Nursing note and vitals reviewed. Constitutional: Well developed, well nourished, non-toxic, and in no  acute distress Head: Normocephalic and atraumatic.  Mouth/Throat: Oropharynx is erythematous posteriorly. Mucous membranes are moist. No trismus.  Neck: Normal range of motion. Neck supple.  tender cervical lymphadenopathy. Cardiovascular: Tachycardia rate and regular rhythm.  No edema. Pulmonary/Chest: Effort normal and breath sounds normal.  Abdominal: Soft. There is no tenderness. There is no rebound and no guarding.  Musculoskeletal: Normal range of motion.  Neurological: Alert, no facial droop, fluent speech, moves all extremities symmetrically Skin: Skin is warm and dry.  Psychiatric: Cooperative  ED Course   Procedures (including critical care time) Labs Review Labs Reviewed  RAPID STREP SCREEN (NOT AT [facility]) - Abnormal; Notable for the following:    Streptococcus, Group A Screen (Direct) POSITIVE (*)    All other components within normal limits  CBC WITH DIFFERENTIAL/PLATELET - Abnormal; Notable for the following:    WBC 12.9 (*)    Neutro Abs 9.0 (*)    Monocytes Absolute 1.1 (*)    All other components within normal limits  COMPREHENSIVE METABOLIC PANEL  URINALYSIS, ROUTINE W REFLEX MICROSCOPIC (NOT AT [facility])  I-STAT BETA HCG BLOOD, ED ([facility], [facility], [facility] ONLY)    Imaging Review Dg Chest 2 View  12/18/2014  CLINICAL DATA:  Pt complains of productive cough, fever, chills, and body aches x 2 weeks. Shielded pt. EXAM: CHEST  2 VIEW COMPARISON:  08/31/2009 FINDINGS: Midline trachea.  Normal heart size and mediastinal contours. Sharp costophrenic angles.  No pneumothorax.  Clear lungs. IMPRESSION: No active cardiopulmonary disease. Electronically Signed   By: Abigail Miyamoto M.D.   On: 12/18/2014 22:14   I have personally reviewed and evaluated these images and lab results as part of my medical decision-making.    MDM   Final diagnoses:  Strep pharyngitis    34 year old female who presents with cough, myalgias, and sore throat. Initially tachycardia, but resolved with PO fluids. Afebrile and HD stable. Strep swab positive, and treated with IM PCN. Basic blood work with mild leukocytosis. No other major metabolic or electrolyte derangements are noted. Her chest x-ray shows no acute cardiopulmonary processes. Discussed strict return and follow-up instructions. She expressed understanding of all discharge instructions and felt comfortable to plan of care.    Forde Dandy, MD 12/18/14 [phone]

## 2015-01-11 DIAGNOSIS — Z719 Counseling, unspecified: Secondary | ICD-10-CM

## 2015-01-16 NOTE — Congregational Nurse Program (Signed)
Congregational Nurse Program Note  Date of Encounter: 01/11/2015  Past Medical History: Past Medical History  Diagnosis Date  . Tachycardia     hx. - had workup 09/2010  . GERD (gastroesophageal reflux disease)   . Constipation 12/12/2011    chronic  . Venous angioma of brain [facility])     states has no problems  . Gallstones 12/2011  . Dental crowns present   . Complication of anesthesia     states has been hard to wake up post-op  . ADHD (attention deficit hyperactivity disorder)     Encounter Details:    This was a consultation with staff of [facility] to discuss writing a letter to [facility]  I have spoken to my director at [facility] and she will cosign a letter of recommendation for client.  I will make appt with Mrs. Moore with client so they can get to know each other.  [facility] staff feels as if client is responsible, hard working, and smart.  Client will be beginning semester at [employer] Jan. 9th.,  Will f/u to make appt.

## 2015-01-25 DIAGNOSIS — Z719 Counseling, unspecified: Secondary | ICD-10-CM

## 2015-02-13 NOTE — Congregational Nurse Program (Signed)
Congregational Nurse Program Note  Date of Encounter: 01/25/2015  Past Medical History: Past Medical History  Diagnosis Date  . Tachycardia     hx. - had workup 09/2010  . GERD (gastroesophageal reflux disease)   . Constipation 12/12/2011    chronic  . Venous angioma of brain [facility])     states has no problems  . Gallstones 12/2011  . Dental crowns present   . Complication of anesthesia     states has been hard to wake up post-op  . ADHD (attention deficit hyperactivity disorder)     Encounter Details:     CNP Questionnaire - 01/25/15 0001    Patient Demographics   Is this a new or existing patient? Existing   Patient is considered a/an Immigrant   Race Asian   Patient Assistance   Location of Patient [place]   Patient's financial/insurance status Low Income   Uninsured Patient Yes   Patient referred to apply for the following financial assistance Not Applicable   Food insecurities addressed Not Applicable   Transportation assistance No   Assistance securing medications No   Educational health offerings Not Applicable   Encounter Details   Primary purpose of visit Spiritual Care/Support Visit   Was an Emergency Department visit averted? No   Does patient have a medical provider? No   Patient referred to Not Applicable   Was a mental health screening completed? (GAINS tool) No   Does patient have dental issues? No   Since previous encounter, have you referred patient for abnormal blood pressure that resulted in a new diagnosis or medication change? No   Since previous encounter, have you referred patient for abnormal blood glucose that resulted in a new diagnosis or medication change? No   For Abstraction Use Only   Does patient have insurance? No      Conference with Kathyrn Drown, [place].  He will write a letter to recommend client to pharmacy dept of [facility].

## 2015-04-13 DIAGNOSIS — Z719 Counseling, unspecified: Secondary | ICD-10-CM

## 2015-05-15 NOTE — Congregational Nurse Program (Signed)
Congregational Nurse Program Note  Date of Encounter: 04/13/2015  Past Medical History: Past Medical History  Diagnosis Date  . Tachycardia     hx. - had workup 09/2010  . GERD (gastroesophageal reflux disease)   . Constipation 12/12/2011    chronic  . Venous angioma of brain [facility])     states has no problems  . Gallstones 12/2011  . Dental crowns present   . Complication of anesthesia     states has been hard to wake up post-op  . 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  Letter of recommendation from [place], [facility].  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  We are proud of her accomplishments.  Will be here for her if needed in future.Client pleasant and appreciative of any help the center has given her.  Will followup as needed.

## 2016-07-04 MED FILL — CYCLOBENZAPRINE 5 MG TABLET: 5 | 20 days supply | Qty: 60 | Fill #0

## 2016-07-04 MED FILL — METHYLPREDNISOLONE 4 MG TAB: 4 | 6 days supply | Qty: 21 | Fill #0

## 2016-07-04 MED FILL — METHOCARBAMOL 500 MG TABLET: 500 | 7 days supply | Qty: 60 | Fill #0

## 2016-07-15 ENCOUNTER — Ambulatory Visit
Payer: PRIVATE HEALTH INSURANCE | Attending: Orthopedic Surgery | Admitting: Rehabilitative and Restorative Service Providers"

## 2016-07-15 ENCOUNTER — Encounter: Payer: Self-pay | Admitting: Rehabilitative and Restorative Service Providers"

## 2016-07-15 DIAGNOSIS — R293 Abnormal posture: Secondary | ICD-10-CM | POA: Insufficient documentation

## 2016-07-15 DIAGNOSIS — M542 Cervicalgia: Secondary | ICD-10-CM | POA: Diagnosis present

## 2016-07-15 DIAGNOSIS — M6281 Muscle weakness (generalized): Secondary | ICD-10-CM | POA: Insufficient documentation

## 2016-07-15 NOTE — Therapy (Signed)
[facility] [address] [place], [place], [address] Phone: [phone]   Fax:  [phone]  Physical Therapy Evaluation  Patient Details  Name: Kathleen Cooper MRN: [id number] Date of Birth: [birth year] Referring Provider: Phylliss Bob, MD  Encounter Date: 07/15/2016      PT End of Session - 07/15/16 1400    Visit Number 1   Number of Visits 12   Date for PT Re-Evaluation 08/26/16   Authorization Type WC   PT Start Time 0930   PT Stop Time 1020   PT Time Calculation (min) 50 min   Activity Tolerance Patient tolerated treatment well;No increased pain;Patient limited by pain   Behavior During Therapy [facility] for tasks assessed/performed      Past Medical History:  Diagnosis Date  . ADHD (attention deficit hyperactivity disorder)   . Complication of anesthesia    states has been hard to wake up post-op  . Constipation 12/12/2011   chronic  . Dental crowns present   . Gallstones 12/2011  . GERD (gastroesophageal reflux disease)   . Tachycardia    hx. - had workup 09/2010  . Venous angioma of brain [facility])    states has no problems    Past Surgical History:  Procedure Laterality Date  . CESAREAN SECTION  12/19/2010   Procedure: CESAREAN SECTION;  Surgeon: Logan Bores;  Location: [facility];  Service: Gynecology;;  Primary cesarean section.   . CHOLECYSTECTOMY  12/18/2011   Procedure: LAPAROSCOPIC CHOLECYSTECTOMY;  Surgeon: Harl Bowie, MD;  Location: [facility];  Service: General;  Laterality: N/A;  . LAPAROSCOPY  2006    There were no vitals filed for this visit.       Subjective Assessment - 07/15/16 0935    Subjective My neck hurts and radiates down R arm to hand with no specific fingers indicated. Currently on light duty not to push/pull > 10 lbs.Taking Robaxin and prednisone. pt noted to hold head certain stiff ways because of pain. cervicalgia has changed from R side to L side  more due to compensation   Pertinent History Pt reports neck pain began 2 weeks ago at work getting a box from a shelf at work and she lost her balance. The box hit her on the R side of the neck. Frontal headaches are occuring along bil eye pressure. Pt denies tinnitus.    How long can you sit comfortably? no problem   How long can you stand comfortably? no problem   How long can you walk comfortably? no problem    Diagnostic tests Xray only with no significant results   Patient Stated Goals to be without pain   Currently in Pain? Yes   Pain Score 8    Pain Location Neck  arm R   Pain Orientation Right;Left   Pain Descriptors / Indicators Pins and needles;Aching;Burning;Pressure   Pain Type Acute pain   Pain Radiating Towards R UE to hand   Pain Onset 1 to 4 weeks ago   Pain Frequency Intermittent   Aggravating Factors  cervical extension , bil cervical lateral flexion, L cervical rotation, R UE horiz abduction    Pain Relieving Factors medicine, icy hot   Effect of Pain on Daily Activities modification of work activities; modification of home activities (can't take out trash)   Multiple Pain Sites No            [facility] PT Assessment - 07/15/16 0001      Assessment  Medical Diagnosis neck pain with R UE radiculopathy   Referring Provider Phylliss Bob, MD   Onset Date/Surgical Date 06/23/16   Hand Dominance Right   Next MD Visit July 25, 2016   Prior Therapy none     Precautions   Precautions None     Restrictions   Other Position/Activity Restrictions no lifting over 10 lbs     Balance Screen   Has the patient fallen in the past 6 months Yes   How many times? 1x at work   Has the patient had a decrease in activity level because of a fear of falling?  No   Is the patient reluctant to leave their home because of a fear of falling?  No     Home Ecologist residence     Prior Function   Level of Independence Independent     Cognition    Overall Cognitive Status Within Functional Limits for tasks assessed     Observation/Other Assessments   Focus on Therapeutic Outcomes (FOTO)  62% limitation     Coordination   Gross Motor Movements are Fluid and Coordinated Yes   Fine Motor Movements are Fluid and Coordinated Yes     Posture/Postural Control   Posture Comments rounded shoulders, holds head tilt to R     ROM / Strength   AROM / PROM / Strength AROM;PROM;Strength     AROM   Overall AROM Comments cervical AROm flex 19, ext 9, R lateral flex 20 and L 40, R rot 32, L rot 36. Shoulder AROM WNL and equal bil     PROM   Overall PROM Comments WNL but with tightness noted with all movements to L due to R cervical tightness     Strength   Overall Strength Comments R UE strength 3+/5 due to discomfort in neck     Palpation   Palpation comment R Upper Trap, R Sternocleidomastoid, R scalene, R thoracic paraspinals tight until T11, hypomobility of C4-5 and T2-3     Special Tests   Cervical Tests other     Spurling's   Comment unable to correctly test due to muscle guarding     Distraction Test   Comment unable to correctly test due to tension     other    Comment median R +, other ULTT -            Objective measurements completed on examination: See above findings.          [facility] Adult PT Treatment/Exercise - 07/15/16 0001      Modalities   Modalities Moist Heat     Moist Heat Therapy   Number Minutes Moist Heat 10 Minutes   Moist Heat Location Cervical;Other (comment)  thoracic spine                PT Education - 07/15/16 1351    Education provided Yes   Education Details discussion on pt benefit of heat at home for further muscle relaxation; demonstrated to pt how to perform R Levator stretch; reviewed anatomy   Person(s) Educated Patient   Methods Explanation;Demonstration   Comprehension Verbalized understanding;Returned demonstration          PT Short Term Goals - 07/15/16  1357      PT SHORT TERM GOAL #1   Title Pt will be I with HEP for pain management   Time 3   Period Weeks   Status New     PT SHORT  TERM GOAL #2   Title Pt will demo improved cervical flexion to 45 degrees to assist with work duties   Time 3   Period Weeks   Status New     PT SHORT TERM GOAL #3   Title Pt will demo improved R lateral flexion to 40 degrees   Time 3   Period Weeks   Status New     PT SHORT TERM GOAL #4   Title Pt will demo improved cervical extension to 15 degrees   Time 3   Period Weeks   Status New           PT Long Term Goals - 07/15/16 1358      PT LONG TERM GOAL #1   Title Pt will report overall cervical pain reduction to 3/10 with all activities   Time 6   Period Weeks   Status New     PT LONG TERM GOAL #2   Title Pt will report 60% pain reduction in full work duties   Time 6   Period Weeks   Status New     PT LONG TERM GOAL #3   Title Pt will be able to lift 15 lbs overhead repeatedly x 10 reps without increased cervical pain   Time 6   Period Weeks   Status New     PT LONG TERM GOAL #4   Title Pt will report 75% reduction in R UE pain to assist with work duties   Time 6   Period Weeks   Status New     PT LONG TERM GOAL #5   Title Pt will demo improved FOTO score to 44% limitation present for neck pain   Time 6   Period Weeks   Status New                Plan - 07/15/16 1352    Clinical Impression Statement Pt presents to PT with cervicalgia bil but R > L and R UE radicular symptoms to hands. Pt demonstrates extremely tight R scalenes, Upper Trap, thoracic paraspinals with hypomobility of C4-5 and T2-3. Pt would benefit from PT for cervical ROM and cervicothoracic flexibility, R median nerve neural glides,postural strengthening, manual therapy, and modalities as needed.. Pt is currently on light duty at work with 10 lb weight restriction.   Clinical Presentation Stable   Clinical Decision Making Low   Rehab Potential  Good   Clinical Impairments Affecting Rehab Potential only wants to see a female provider   PT Frequency 2x / week   PT Duration 6 weeks   PT Treatment/Interventions Electrical Stimulation;Cryotherapy;Iontophoresis 4mg /ml Dexamethasone;Moist Heat;Traction;Ultrasound;Therapeutic exercise;Therapeutic activities;Patient/family education;Manual techniques;Passive range of motion;Dry needling;Taping   PT Next Visit Plan issue cervical HEP for flexibility and scapular depression; R median nerve neural glides, [place] to bil cervical paraspinals   PT Home Exercise Plan see pt education   Consulted and Agree with Plan of Care Patient      Patient will benefit from skilled therapeutic intervention in order to improve the following deficits and impairments:  Decreased activity tolerance, Decreased range of motion, Decreased mobility, Hypomobility, Increased fascial restricitons, Increased muscle spasms, Impaired flexibility, Postural dysfunction, Improper body mechanics, Pain  Visit Diagnosis: Cervicalgia - Plan: PT plan of care cert/re-cert  Abnormal posture - Plan: PT plan of care cert/re-cert  Muscle weakness (generalized) - Plan: PT plan of care cert/re-cert     Problem List Patient Active Problem List   Diagnosis Date Noted  . Symptomatic cholelithiasis 12/01/2011  .  Carotid bruit 11/04/2010  . Palpitations 09/27/2010  . Murmur 09/27/2010  . Chest pain 09/27/2010  . SOB (shortness of breath) 09/27/2010    Ogechi Kuehnel, PT 07/15/2016, 2:09 PM  [facility] [address] [place], [place], [address] Phone: [phone]   Fax:  [phone]  Name: Maggi Hershkowitz MRN: [id number] Date of Birth: May 21, 1980

## 2016-07-17 ENCOUNTER — Ambulatory Visit: Payer: PRIVATE HEALTH INSURANCE | Admitting: Rehabilitative and Restorative Service Providers"

## 2016-07-17 DIAGNOSIS — M542 Cervicalgia: Secondary | ICD-10-CM | POA: Diagnosis not present

## 2016-07-17 DIAGNOSIS — M6281 Muscle weakness (generalized): Secondary | ICD-10-CM

## 2016-07-17 DIAGNOSIS — R293 Abnormal posture: Secondary | ICD-10-CM

## 2016-07-17 NOTE — Therapy (Signed)
[facility] [address], [place], [place], [address] Phone: [phone]   Fax:  [phone]  Physical Therapy Treatment  Patient Details  Name: Marlina Cataldi MRN: [id number] Date of Birth: Apr 19, 1980 Referring Provider: Phylliss Bob, MD  Encounter Date: 07/17/2016      PT End of Session - 07/17/16 0856    Visit Number 2   Number of Visits 12   Date for PT Re-Evaluation 08/26/16   Authorization Type WC   Authorization - Visit Number 8   PT Start Time 9735   PT Stop Time 0916   PT Time Calculation (min) 80 min   Activity Tolerance Patient tolerated treatment well;No increased pain   Behavior During Therapy [facility] for tasks assessed/performed      Past Medical History:  Diagnosis Date  . ADHD (attention deficit hyperactivity disorder)   . Complication of anesthesia    states has been hard to wake up post-op  . Constipation 12/12/2011   chronic  . Dental crowns present   . Gallstones 12/2011  . GERD (gastroesophageal reflux disease)   . Tachycardia    hx. - had workup 09/2010  . Venous angioma of brain [facility])    states has no problems    Past Surgical History:  Procedure Laterality Date  . CESAREAN SECTION  12/19/2010   Procedure: CESAREAN SECTION;  Surgeon: Logan Bores;  Location: [facility];  Service: Gynecology;;  Primary cesarean section.   . CHOLECYSTECTOMY  12/18/2011   Procedure: LAPAROSCOPIC CHOLECYSTECTOMY;  Surgeon: Harl Bowie, MD;  Location: [facility];  Service: General;  Laterality: N/A;  . LAPAROSCOPY  2006    There were no vitals filed for this visit.      Subjective Assessment - 07/17/16 0819    Subjective My neck and R arm felt a lot better until this morning when I woke up.   Pertinent History Pt reports neck pain began 2 weeks ago at work getting a box from a shelf at work and she lost her balance. The box hit her on the R side of the neck. Frontal headaches  are occuring along bil eye pressure. Pt denies tinnitus.    How long can you sit comfortably? no problem   How long can you stand comfortably? no problem   How long can you walk comfortably? no problem    Diagnostic tests Xray only with no significant results   Patient Stated Goals to be without pain   Currently in Pain? Yes   Pain Score 10-Worst pain ever   Pain Location Neck   Pain Orientation Right;Left   Pain Descriptors / Indicators Aching   Pain Type Acute pain   Pain Radiating Towards R UE to hand   Pain Onset 1 to 4 weeks ago   Pain Frequency Intermittent   Aggravating Factors  cervical extension   Pain Relieving Factors medicine, icy hot   Effect of Pain on Daily Activities modification of work/home activities   Multiple Pain Sites No                         [facility] Adult PT Treatment/Exercise - 07/17/16 0001      Neck Exercises: Seated   Other Seated Exercise 2 sets: Upper Trap stretch 2x30 sec, Levator stretch 2x30 sec, R scalene stretch 2x30 sec. HEP issued and reviewed     Neck Exercises: Supine   Other Supine Exercise chin tucks x 10, scapular  retract x 15, scapular depression x 15 with cervical lateral flexion x 20, isometric chin tuck with scapular retract x 15, shoulder ext isometric x 15 with 2-3 sec holds     Moist Heat Therapy   Number Minutes Moist Heat 20 Minutes   Moist Heat Location Cervical     Electrical Stimulation   Electrical Stimulation Location Bil Upper Trap/Rhomboids   Electrical Stimulation Action for pain   Electrical Stimulation Parameters 80-150 x 20 min   Electrical Stimulation Goals Pain     Ultrasound   Ultrasound Location R Upper Trap/scalenes   Ultrasound Parameters 100% 2.5w/cm2 x 8 min   Ultrasound Goals Pain     Manual Therapy   Manual therapy comments soft tissue work/trigger point R > L; Upper Trap, Rhomboids bil; R posterior scalenes                PT Education - 07/17/16 0855    Education  provided Yes   Education Details advised pt again to purchase a hot pack; HEP issued: Upper Trap, Levator Stretch, Scalene Stretch 2x30 sec 2x/day; scap retract x 20 2x/day; recommended pt decrease sleeping from 2 fluffy pillows to 1 pillow with towel roll in bottom of pillowcase   Person(s) Educated Patient   Methods Explanation;Demonstration   Comprehension Verbalized understanding;Returned demonstration          PT Short Term Goals - 07/15/16 1357      PT SHORT TERM GOAL #1   Title Pt will be I with HEP for pain management   Time 3   Period Weeks   Status New     PT SHORT TERM GOAL #2   Title Pt will demo improved cervical flexion to 45 degrees to assist with work duties   Time 3   Period Weeks   Status New     PT SHORT TERM GOAL #3   Title Pt will demo improved R lateral flexion to 40 degrees   Time 3   Period Weeks   Status New     PT SHORT TERM GOAL #4   Title Pt will demo improved cervical extension to 15 degrees   Time 3   Period Weeks   Status New           PT Long Term Goals - 07/15/16 1358      PT LONG TERM GOAL #1   Title Pt will report overall cervical pain reduction to 3/10 with all activities   Time 6   Period Weeks   Status New     PT LONG TERM GOAL #2   Title Pt will report 60% pain reduction in full work duties   Time 6   Period Weeks   Status New     PT LONG TERM GOAL #3   Title Pt will be able to lift 15 lbs overhead repeatedly x 10 reps without increased cervical pain   Time 6   Period Weeks   Status New     PT LONG TERM GOAL #4   Title Pt will report 75% reduction in R UE pain to assist with work duties   Time 6   Period Weeks   Status New     PT LONG TERM GOAL #5   Title Pt will demo improved FOTO score to 44% limitation present for neck pain   Time 6   Period Weeks   Status New               Plan -  07/17/16 0859    Clinical Impression Statement Pt presents to PT with improved cervical pain overall from  evaluation. pt states she woke up this morning with 10/10 cervical pain. Upon discussion, pt slept with 2 fluffy pillows. Discussed with pt to decrease to 1 pillow with towel roll at pillowcase base for further cervical stability. Pt reports R UE radicular sxs to have improved. Pt would benefit from further PT for flexibility of R scalenes, Upper Trap, thoracic paraspinals with spinal mobs of C4-5 and T2-3. Proper postural strengthening also necessary for return to work functions and R median nerve glides as needed. 10 lb weight restriction per pt per MD.   Rehab Potential Good   Clinical Impairments Affecting Rehab Potential only wants to see a female provider; cannot lay supine for long (gets nauseous...long standing problem per pt)   PT Frequency 2x / week   PT Duration 6 weeks   PT Treatment/Interventions Electrical Stimulation;Cryotherapy;Iontophoresis 4mg /ml Dexamethasone;Moist Heat;Traction;Ultrasound;Therapeutic exercise;Therapeutic activities;Patient/family education;Manual techniques;Passive range of motion;Dry needling;Taping   PT Next Visit Plan review cervical HEP, [place]; discussed with pt dry needling in next 2 treatments if she continues to have same intensity pain; see how cervical pain was in AM after decreasing to 1 pillow with towel roll for extra cervical support   PT Home Exercise Plan see pt education   Consulted and Agree with Plan of Care Patient      Patient will benefit from skilled therapeutic intervention in order to improve the following deficits and impairments:  Decreased activity tolerance, Decreased range of motion, Decreased mobility, Hypomobility, Increased fascial restricitons, Increased muscle spasms, Impaired flexibility, Postural dysfunction, Improper body mechanics, Pain  Visit Diagnosis: Cervicalgia  Abnormal posture  Muscle weakness (generalized)     Problem List Patient Active Problem List   Diagnosis Date Noted  . Symptomatic cholelithiasis  12/01/2011  . Carotid bruit 11/04/2010  . Palpitations 09/27/2010  . Murmur 09/27/2010  . Chest pain 09/27/2010  . SOB (shortness of breath) 09/27/2010    Kiegan Macaraeg,Leronda Lewers, PT 07/17/2016, 9:11 AM  [facility] [address] [place], [place], [address] Phone: [phone]   Fax:  [phone]  Name: Gilberte Gorley MRN: [id number] Date of Birth: Mar 04, 1980

## 2016-07-22 ENCOUNTER — Ambulatory Visit: Payer: PRIVATE HEALTH INSURANCE | Admitting: Rehabilitative and Restorative Service Providers"

## 2016-07-22 DIAGNOSIS — M542 Cervicalgia: Secondary | ICD-10-CM | POA: Diagnosis not present

## 2016-07-22 DIAGNOSIS — M6281 Muscle weakness (generalized): Secondary | ICD-10-CM

## 2016-07-22 DIAGNOSIS — R293 Abnormal posture: Secondary | ICD-10-CM

## 2016-07-22 NOTE — Therapy (Signed)
[facility] [address], [place], [place], [address] Phone: [phone]   Fax:  [phone]  Physical Therapy Treatment  Patient Details  Name: Kathleen Cooper MRN: [id number] Date of Birth: [birth year] Referring Provider: Phylliss Bob, MD  Encounter Date: 07/22/2016      PT End of Session - 07/22/16 0940    Visit Number 3   Number of Visits 12   Date for PT Re-Evaluation 08/26/16   Authorization Type WC   Authorization - Visit Number 8   PT Start Time 1950   PT Stop Time 0956   PT Time Calculation (min) 67 min      Past Medical History:  Diagnosis Date  . ADHD (attention deficit hyperactivity disorder)   . Complication of anesthesia    states has been hard to wake up post-op  . Constipation 12/12/2011   chronic  . Dental crowns present   . Gallstones 12/2011  . GERD (gastroesophageal reflux disease)   . Tachycardia    hx. - had workup 09/2010  . Venous angioma of brain [facility])    states has no problems    Past Surgical History:  Procedure Laterality Date  . CESAREAN SECTION  12/19/2010   Procedure: CESAREAN SECTION;  Surgeon: Logan Bores;  Location: [place] [facility];  Service: Gynecology;;  Primary cesarean section.   . CHOLECYSTECTOMY  12/18/2011   Procedure: LAPAROSCOPIC CHOLECYSTECTOMY;  Surgeon: Harl Bowie, MD;  Location: [place];  Service: General;  Laterality: N/A;  . LAPAROSCOPY  2006    There were no vitals filed for this visit.      Subjective Assessment - 07/22/16 0859    Subjective it is so much better and the intensity in my arm has decreased a lot. the towel in my pillowcase has helped decrease the pain a lot in the mornings too. Pain is on both sides of my neck now.   Pertinent History Pt reports neck pain began 2 weeks ago at work getting a box from a shelf at work and she lost her balance. The box hit her on the R side of the neck. Frontal headaches are occuring  along bil eye pressure. Pt denies tinnitus.    How long can you sit comfortably? no problem   How long can you stand comfortably? no problem   How long can you walk comfortably? no problem    Diagnostic tests Xray only with no significant results   Patient Stated Goals to be without pain   Currently in Pain? Yes   Pain Score 6    Pain Location Neck   Pain Orientation Right;Left   Pain Descriptors / Indicators Aching   Pain Radiating Towards R UE to hand   Pain Onset 1 to 4 weeks ago   Pain Frequency Intermittent   Aggravating Factors  work duties   Pain Relieving Factors medicine, exercises, TENS   Effect of Pain on Daily Activities modification of work/home activities   Multiple Pain Sites No                         [facility] Adult PT Treatment/Exercise - 07/22/16 0001      Neck Exercises: Seated   Other Seated Exercise reviewed HEP: Upper Trap, Scalene, Rhomboid stretches bil 2x30 sec each; Scap retract x20 with max PT verbal cues for proper technique; Rhomboid stretch 2x30 sec; bil scap protraction 5x30 sec     Moist Heat Therapy  Number Minutes Moist Heat 20 Minutes   Moist Heat Location Cervical;Other (comment)  Upper Thoracic     Electrical Stimulation   Electrical Stimulation Location Bil Upper Trap/Rhomboids   Electrical Stimulation Action for pain   Electrical Stimulation Parameters 80-150 x 20 min   Electrical Stimulation Goals Pain     Ultrasound   Ultrasound Location Bil Upper Trap/posterior scalene   Ultrasound Parameters 100% 2.5w/cm2 x 8 min (4 min each side)   Ultrasound Goals Pain     Manual Therapy   Manual therapy comments soft tissue work/trigger point R > L; Upper Trap, Rhomboids bil; R posterior scalenes                  PT Short Term Goals - 07/22/16 0942      PT SHORT TERM GOAL #1   Title Pt will be I with HEP for pain management   Time 3   Period Weeks   Status Achieved     PT SHORT TERM GOAL #2   Title Pt will  demo improved cervical flexion to 60 degrees to assist with work duties   Baseline 52   Time 3   Period Weeks   Status New     PT SHORT TERM GOAL #3   Title Pt will demo improved R lateral flexion to 40 degrees   Time 3   Period Weeks   Status On-going     PT SHORT TERM GOAL #4   Title Pt will demo improved cervical extension to 15 degrees   Time 3   Period Weeks   Status On-going           PT Long Term Goals - 07/15/16 1358      PT LONG TERM GOAL #1   Title Pt will report overall cervical pain reduction to 3/10 with all activities   Time 6   Period Weeks   Status New     PT LONG TERM GOAL #2   Title Pt will report 60% pain reduction in full work duties   Time 6   Period Weeks   Status New     PT LONG TERM GOAL #3   Title Pt will be able to lift 15 lbs overhead repeatedly x 10 reps without increased cervical pain   Time 6   Period Weeks   Status New     PT LONG TERM GOAL #4   Title Pt will report 75% reduction in R UE pain to assist with work duties   Time 6   Period Weeks   Status New     PT LONG TERM GOAL #5   Title Pt will demo improved FOTO score to 44% limitation present for neck pain   Time 6   Period Weeks   Status New               Plan - 07/22/16 0940    Clinical Impression Statement Pt presents to PT with continuing improved cervical pain from prior treatments. Pt demonstrates tightness bil Upper Trap, R Rhomboid and L first rib. Pt very tender to pressure to L first rib and R Upper Trap/Rhomboid. Pt would benefit from further PT for manual therapy, modalities, postural correction, and continued cervical and upper Thoracic pain management. 10 lb weight restriction per MD.    Clinical Impairments Affecting Rehab Potential only wants to see a female provider; cannot lay supine for long (gets nauseous...long standing problem per pt)   PT Frequency 2x / week  PT Duration 6 weeks   PT Treatment/Interventions Electrical  Stimulation;Cryotherapy;Iontophoresis 4mg /ml Dexamethasone;Moist Heat;Traction;Ultrasound;Therapeutic exercise;Therapeutic activities;Patient/family education;Manual techniques;Passive range of motion;Dry needling;Taping   PT Next Visit Plan [place]; discussed with pt dry needling in next 1-2 treatments if she continues to have same intensity pain; continue manual bil Upper Trap, cervical flexibility therex and beginning postural strengthening   PT Home Exercise Plan see pt education   Consulted and Agree with Plan of Care Patient      Patient will benefit from skilled therapeutic intervention in order to improve the following deficits and impairments:  Decreased activity tolerance, Decreased range of motion, Decreased mobility, Hypomobility, Increased fascial restricitons, Increased muscle spasms, Impaired flexibility, Postural dysfunction, Improper body mechanics, Pain  Visit Diagnosis: Cervicalgia  Abnormal posture  Muscle weakness (generalized)     Problem List Patient Active Problem List   Diagnosis Date Noted  . Symptomatic cholelithiasis 12/01/2011  . Carotid bruit 11/04/2010  . Palpitations 09/27/2010  . Murmur 09/27/2010  . Chest pain 09/27/2010  . SOB (shortness of breath) 09/27/2010    Sevyn Paredez, PT 07/22/2016, 9:49 AM  [facility] [address] [place], [place], [address] Phone: [phone]   Fax:  [phone]  Name: Sharlee Rufino MRN: [id number] Date of Birth: 1980/02/21

## 2016-07-24 ENCOUNTER — Ambulatory Visit: Payer: PRIVATE HEALTH INSURANCE | Attending: Orthopedic Surgery

## 2016-07-24 DIAGNOSIS — M542 Cervicalgia: Secondary | ICD-10-CM | POA: Insufficient documentation

## 2016-07-24 DIAGNOSIS — M6281 Muscle weakness (generalized): Secondary | ICD-10-CM | POA: Diagnosis present

## 2016-07-24 DIAGNOSIS — R293 Abnormal posture: Secondary | ICD-10-CM | POA: Diagnosis present

## 2016-07-24 NOTE — Patient Instructions (Addendum)
Over Head Pull: Narrow Grip       On back, knees bent, feet flat, band across thighs, elbows straight but relaxed. Pull hands apart (start). Keeping elbows straight, bring arms up and over head, hands toward floor. Keep pull steady on band. Hold momentarily. Return slowly, keeping pull steady, back to start. Repeat __10_ times. Band color _red___   Side Pull: Double Arm   On back, knees bent, feet flat. Arms perpendicular to body, shoulder level, elbows straight but relaxed. Pull arms out to sides, elbows straight. Resistance band comes across collarbones, hands toward floor. Hold momentarily. Slowly return to starting position. Repeat _10__ times. Band color _red___     Shoulder Rotation: Double Arm   On back, knees bent, feet flat, elbows tucked at sides, bent 90, hands palms up. Pull hands apart and down toward floor, keeping elbows near sides. Hold momentarily. Slowly return to starting position. Repeat _10__ times. Band color __red   1-2 sets of each.  1-2 times a day      [facility] [address] [place],  [address] Phone # [phone] Fax [phone]____

## 2016-07-24 NOTE — Therapy (Signed)
[facility] [address], [place], [place], [address] Phone: [phone]   Fax:  [phone]  Physical Therapy Treatment  Patient Details  Name: Kathleen Cooper MRN: [id number] Date of Birth: Mar 12, 1980 Referring Provider: Phylliss Bob, MD  Encounter Date: 07/24/2016      PT End of Session - 07/24/16 0801    Visit Number 4   Number of Visits 8   Date for PT Re-Evaluation 08/26/16   Authorization Type W/C 8 visits approved- [place] - Visit Number 4   Authorization - Number of Visits 8   PT Start Time [phone]   PT Stop Time 0825   PT Time Calculation (min) 52 min   Activity Tolerance Patient tolerated treatment well   Behavior During Therapy [facility] for tasks assessed/performed      Past Medical History:  Diagnosis Date  . ADHD (attention deficit hyperactivity disorder)   . Complication of anesthesia    states has been hard to wake up post-op  . Constipation 12/12/2011   chronic  . Dental crowns present   . Gallstones 12/2011  . GERD (gastroesophageal reflux disease)   . Tachycardia    hx. - had workup 09/2010  . Venous angioma of brain [facility])    states has no problems    Past Surgical History:  Procedure Laterality Date  . CESAREAN SECTION  12/19/2010   Procedure: CESAREAN SECTION;  Surgeon: Logan Bores;  Location: [place] [facility];  Service: Gynecology;;  Primary cesarean section.   . CHOLECYSTECTOMY  12/18/2011   Procedure: LAPAROSCOPIC CHOLECYSTECTOMY;  Surgeon: Harl Bowie, MD;  Location: [place];  Service: General;  Laterality: N/A;  . LAPAROSCOPY  2006    There were no vitals filed for this visit.      Subjective Assessment - 07/24/16 0736    Subjective I feel good for a day or 2 after PT and then pain returns to the same level.     Pertinent History Pt reports neck pain began 2 weeks ago at work getting a box from a shelf at work and she lost her balance. The  box hit her on the R side of the neck. Frontal headaches are occuring along bil eye pressure. Pt denies tinnitus.    Currently in Pain? Yes   Pain Score 6    Pain Location Neck   Pain Orientation Right;Left   Pain Descriptors / Indicators Aching;Burning   Pain Type Acute pain   Pain Radiating Towards Rt UE to hand   Pain Onset 1 to 4 weeks ago   Aggravating Factors  work duties, use of Rt UE, lifting/carrying   Pain Relieving Factors medication, stretching, TENs            [facility] PT Assessment - 07/24/16 0001      Assessment   Medical Diagnosis neck pain with R UE radiculopathy     [place] residence     Prior Function   Level of Independence Independent     ROM / Strength   AROM / PROM / Strength AROM;PROM;Strength     AROM   Overall AROM  Deficits   AROM Assessment Site Cervical   Cervical Flexion 55   Cervical - Right Side Bend 30   Cervical - Left Side Bend 35   Cervical - Right Rotation 65   Cervical - Left Rotation 75     Strength   Overall Strength Comments Rt  grip= to Lt     Palpation   Palpation comment R Upper Trap, R Sternocleidomastoid, R scalene, R thoracic paraspinals tight until T11, hypomobility of C4-5 and T2-3                     [facility] Adult PT Treatment/Exercise - 07/24/16 0001      Exercises   Exercises Neck;Shoulder     Neck Exercises: Machines for Strengthening   UBE (Upper Arm Bike) Level 1 x 4 minutes (2/2)     Neck Exercises: Supine   Other Supine Exercise red theraband: D2, overhead flexion and ER      Modalities   Modalities Traction     Moist Heat Therapy   Number Minutes Moist Heat 15 Minutes   Moist Heat Location Cervical;Other (comment)     Electrical Stimulation   Electrical Stimulation Location neck and bil trap and rhomboids   Electrical Stimulation Action IFC   Electrical Stimulation Parameters 15 minutes   Electrical Stimulation Goals Pain     Traction   Type of  Traction Cervical   Min (lbs) 5   Max (lbs) 15   Hold Time 60   Rest Time 10   Time 10     Neck Exercises: Stretches   Upper Trapezius Stretch 3 reps;20 seconds   Neck Stretch 3 reps;20 seconds                PT Education - 07/24/16 0801    Education provided Yes   Education Details supine theraband   Person(s) Educated Patient   Methods Explanation;Demonstration;Handout   Comprehension Verbalized understanding;Returned demonstration          PT Short Term Goals - 07/24/16 0752      PT SHORT TERM GOAL #1   Title Pt will be I with HEP for pain management   Status Achieved     PT SHORT TERM GOAL #2   Title Pt will demo improved cervical flexion to 60 degrees to assist with work duties   Baseline 55   Status Achieved     PT SHORT TERM GOAL #3   Title Pt will demo improved R lateral flexion to 40 degrees   Baseline 30           PT Long Term Goals - 07/15/16 1358      PT LONG TERM GOAL #1   Title Pt will report overall cervical pain reduction to 3/10 with all activities   Time 6   Period Weeks   Status New     PT LONG TERM GOAL #2   Title Pt will report 60% pain reduction in full work duties   Time 6   Period Weeks   Status New     PT LONG TERM GOAL #3   Title Pt will be able to lift 15 lbs overhead repeatedly x 10 reps without increased cervical pain   Time 6   Period Weeks   Status New     PT LONG TERM GOAL #4   Title Pt will report 75% reduction in R UE pain to assist with work duties   Time 6   Period Weeks   Status New     PT LONG TERM GOAL #5   Title Pt will demo improved FOTO score to 44% limitation present for neck pain   Time 6   Period Weeks   Status New  Plan - 07/24/16 0746    Clinical Impression Statement Pt with improved cervical A/ROM today.  Pt reports that she has relief of symptoms x 2 days after treatment.  Pt with tension and trigger points in Rt Upper trap and thoracic spine.  Pt reports Rt UE  weakness with use.  Trial of traction today to determine impact on Rt UE radiculopathy.  Pt will benefit from continued PT for manual therapy for neck and thoracic spine, modalities for pain, postural strength, Rt UE strength and cervical flexibility.     Clinical Impairments Affecting Rehab Potential only wants to see a female provider; cannot lay supine for long (gets nauseous...long standing problem per pt)   PT Frequency 2x / week   PT Duration 6 weeks   PT Treatment/Interventions Electrical Stimulation;Cryotherapy;Iontophoresis 4mg /ml Dexamethasone;Moist Heat;Traction;Ultrasound;Therapeutic exercise;Therapeutic activities;Patient/family education;Manual techniques;Passive range of motion;Dry needling;Taping   PT Next Visit Plan continue traction if helpful; discussed with pt dry needling in next 1-2 treatments if she continues to have same intensity pain; continue manual bil Upper Trap, cervical flexibility therex and beginning postural strengthening   Consulted and Agree with Plan of Care Patient      Patient will benefit from skilled therapeutic intervention in order to improve the following deficits and impairments:  Decreased activity tolerance, Decreased range of motion, Decreased mobility, Hypomobility, Increased fascial restricitons, Increased muscle spasms, Impaired flexibility, Postural dysfunction, Improper body mechanics, Pain  Visit Diagnosis: Cervicalgia  Abnormal posture  Muscle weakness (generalized)     Problem List Patient Active Problem List   Diagnosis Date Noted  . Symptomatic cholelithiasis 12/01/2011  . Carotid bruit 11/04/2010  . Palpitations 09/27/2010  . Murmur 09/27/2010  . Chest pain 09/27/2010  . SOB (shortness of breath) 09/27/2010    Sigurd Sos, PT 07/24/16 8:04 AM  [facility] [address], [place], [place], [address] Phone: [phone]   Fax:  [phone]  Name: Destin Kittler MRN: [id number] Date of Birth: 1980/07/27

## 2016-07-25 MED FILL — MELOXICAM 15 MG TABLET: 15 | 30 days supply | Qty: 30 | Fill #0

## 2016-07-28 ENCOUNTER — Encounter: Payer: Self-pay | Admitting: Physical Therapy

## 2016-07-28 ENCOUNTER — Other Ambulatory Visit (HOSPITAL_COMMUNITY): Payer: Self-pay | Admitting: Orthopedic Surgery

## 2016-07-28 ENCOUNTER — Ambulatory Visit: Payer: PRIVATE HEALTH INSURANCE | Admitting: Physical Therapy

## 2016-07-28 DIAGNOSIS — M6281 Muscle weakness (generalized): Secondary | ICD-10-CM

## 2016-07-28 DIAGNOSIS — M5412 Radiculopathy, cervical region: Secondary | ICD-10-CM

## 2016-07-28 DIAGNOSIS — M542 Cervicalgia: Secondary | ICD-10-CM

## 2016-07-28 DIAGNOSIS — R293 Abnormal posture: Secondary | ICD-10-CM

## 2016-07-28 NOTE — Therapy (Signed)
[facility] [address] [place], [place], [address] Phone: [phone]   Fax:  [phone]  Physical Therapy Treatment  Patient Details  Name: Kathleen Cooper MRN: [id number] Date of Birth: 1980/01/11 Referring Provider: Phylliss Bob, MD  Encounter Date: 07/28/2016      PT End of Session - 07/28/16 0818    Visit Number 5   Number of Visits 8   Date for PT Re-Evaluation 08/26/16   Authorization Type W/C 8 visits approved- [place] - Visit Number 5   Authorization - Number of Visits 8   PT Start Time 0800   PT Stop Time 0900   PT Time Calculation (min) 60 min   Activity Tolerance Patient tolerated treatment well   Behavior During Therapy [facility] for tasks assessed/performed      Past Medical History:  Diagnosis Date  . ADHD (attention deficit hyperactivity disorder)   . Complication of anesthesia    states has been hard to wake up post-op  . Constipation 12/12/2011   chronic  . Dental crowns present   . Gallstones 12/2011  . GERD (gastroesophageal reflux disease)   . Tachycardia    hx. - had workup 09/2010  . Venous angioma of brain [facility])    states has no problems    Past Surgical History:  Procedure Laterality Date  . CESAREAN SECTION  12/19/2010   Procedure: CESAREAN SECTION;  Surgeon: Logan Bores;  Location: [facility];  Service: Gynecology;;  Primary cesarean section.   . CHOLECYSTECTOMY  12/18/2011   Procedure: LAPAROSCOPIC CHOLECYSTECTOMY;  Surgeon: Harl Bowie, MD;  Location: [place];  Service: General;  Laterality: N/A;  . LAPAROSCOPY  2006    There were no vitals filed for this visit.      Subjective Assessment - 07/28/16 0802    Subjective When I leave I feel okay but when I return the pain is back. I  saw the doctor on Friday and he wants me to continue therapy.  I am to have a MRI.  He gave me Mobic. The stretching therapy I had last time  helped.    Pertinent History Pt reports neck pain began 2 weeks ago at work getting a box from a shelf at work and she lost her balance. The box hit her on the R side of the neck. Frontal headaches are occuring along bil eye pressure. Pt denies tinnitus.    How long can you sit comfortably? no problem   How long can you stand comfortably? no problem   How long can you walk comfortably? no problem    Diagnostic tests Xray only with no significant results   Patient Stated Goals to be without pain   Currently in Pain? Yes   Pain Score 6    Pain Location Neck  shoulders and right hand   Pain Orientation Right;Left   Pain Descriptors / Indicators Aching;Burning   Pain Type Acute pain   Pain Radiating Towards right upper extremity to hand   Pain Onset 1 to 4 weeks ago   Pain Frequency Intermittent   Aggravating Factors  work duties, use of right UE, lifting/carrying   Pain Relieving Factors medication, stretching, TENS   Effect of Pain on Daily Activities modification of work/home activities   Multiple Pain Sites No            [facility] PT Assessment - 07/28/16 0001      AROM   Cervical Extension  35   Cervical - Right Side Bend 40                     [facility] Adult PT Treatment/Exercise - 07/28/16 0001      Neck Exercises: Supine   Other Supine Exercise red theraband: D2, overhead flexion and ER      Modalities   Modalities Traction     Moist Heat Therapy   Moist Heat Location Cervical;Other (comment)     Electrical Stimulation   Electrical Stimulation Location neck and bil trap and rhomboids   Electrical Stimulation Action IFC   Electrical Stimulation Parameters to patient tolerance, 15 min   Electrical Stimulation Goals Pain     Traction   Type of Traction Cervical   Min (lbs) 5   Max (lbs) 1518   Hold Time 60   Rest Time 10   Time 10                  PT Short Term Goals - 07/28/16 0806      PT SHORT TERM GOAL #1   Title Pt will be I with  HEP for pain management   Time 3   Period Weeks   Status Achieved     PT SHORT TERM GOAL #2   Title Pt will demo improved cervical flexion to 60 degrees to assist with work duties   Baseline 55   Time 3   Period Weeks   Status Achieved     PT SHORT TERM GOAL #3   Title Pt will demo improved R lateral flexion to 40 degrees   Baseline 30   Time 3   Period Weeks   Status Achieved     PT SHORT TERM GOAL #4   Title Pt will demo improved cervical extension to 15 degrees   Time 3   Period Weeks   Status Achieved           PT Long Term Goals - 07/15/16 1358      PT LONG TERM GOAL #1   Title Pt will report overall cervical pain reduction to 3/10 with all activities   Time 6   Period Weeks   Status New     PT LONG TERM GOAL #2   Title Pt will report 60% pain reduction in full work duties   Time 6   Period Weeks   Status New     PT LONG TERM GOAL #3   Title Pt will be able to lift 15 lbs overhead repeatedly x 10 reps without increased cervical pain   Time 6   Period Weeks   Status New     PT LONG TERM GOAL #4   Title Pt will report 75% reduction in R UE pain to assist with work duties   Time 6   Period Weeks   Status New     PT LONG TERM GOAL #5   Title Pt will demo improved FOTO score to 44% limitation present for neck pain   Time 6   Period Weeks   Status New               Plan - 07/28/16 2409    Clinical Impression Statement Patient has improved cervical ROM and has met all of her STG's.  Patient has seen the doctor and will be getting a MRI.  Patient has relief after therapy but when she returns the symptoms return by next visit.  Patient will benefit from skilled  therapy to continue manyual therapy for neck and thoracic spine, modalities for pain, postureal strength, right upper extremiity and cervical flexibility.    Rehab Potential Good   Clinical Impairments Affecting Rehab Potential only wants to see a female provider; cannot lay supine for long  (gets nauseous...long standing problem per pt)   PT Frequency 2x / week   PT Duration 6 weeks   PT Treatment/Interventions Electrical Stimulation;Cryotherapy;Iontophoresis 40m/ml Dexamethasone;Moist Heat;Traction;Ultrasound;Therapeutic exercise;Therapeutic activities;Patient/family education;Manual techniques;Passive range of motion;Dry needling;Taping   PT Next Visit Plan cervical traction; see if she would like to try dry needling; no UBE due to increasing hand symptoms; try supine with head on physioball to work on cervical stabilization; check o order if signed and if not send second request   PT Home Exercise Plan progress as needed   Consulted and Agree with Plan of Care Patient      Patient will benefit from skilled therapeutic intervention in order to improve the following deficits and impairments:  Decreased activity tolerance, Decreased range of motion, Decreased mobility, Hypomobility, Increased fascial restricitons, Increased muscle spasms, Impaired flexibility, Postural dysfunction, Improper body mechanics, Pain  Visit Diagnosis: Cervicalgia  Abnormal posture  Muscle weakness (generalized)     Problem List Patient Active Problem List   Diagnosis Date Noted  . Symptomatic cholelithiasis 12/01/2011  . Carotid bruit 11/04/2010  . Palpitations 09/27/2010  . Murmur 09/27/2010  . Chest pain 09/27/2010  . SOB (shortness of breath) 09/27/2010   CEarlie Counts PT 07/28/16 8:30 AM    [facility] [address] [place] [address]Phone: [phone]  Fax:  [phone] Name: Kathleen CapesMRN: [id number] of Birth: 4May 13, 1982

## 2016-07-31 ENCOUNTER — Ambulatory Visit: Payer: PRIVATE HEALTH INSURANCE

## 2016-07-31 DIAGNOSIS — M6281 Muscle weakness (generalized): Secondary | ICD-10-CM

## 2016-07-31 DIAGNOSIS — R293 Abnormal posture: Secondary | ICD-10-CM

## 2016-07-31 DIAGNOSIS — M542 Cervicalgia: Secondary | ICD-10-CM

## 2016-07-31 NOTE — Therapy (Signed)
[facility] [address], [place], [place], [address] Phone: [phone]   Fax:  [phone]  Physical Therapy Treatment  Patient Details  Name: Kathleen Cooper MRN: [id number] Date of Birth: 1980/06/18 Referring Provider: Phylliss Bob, MD  Encounter Date: 07/31/2016      PT End of Session - 07/31/16 0809    Visit Number 6   Number of Visits 8   Date for PT Re-Evaluation 08/26/16   Authorization Type W/C 8 visits approved- [place] - Visit Number 6   Authorization - Number of Visits 8   PT Start Time [phone]   PT Stop Time 0825   PT Time Calculation (min) 61 min   Activity Tolerance Patient tolerated treatment well   Behavior During Therapy [facility] for tasks assessed/performed      Past Medical History:  Diagnosis Date  . ADHD (attention deficit hyperactivity disorder)   . Complication of anesthesia    states has been hard to wake up post-op  . Constipation 12/12/2011   chronic  . Dental crowns present   . Gallstones 12/2011  . GERD (gastroesophageal reflux disease)   . Tachycardia    hx. - had workup 09/2010  . Venous angioma of brain [facility])    states has no problems    Past Surgical History:  Procedure Laterality Date  . CESAREAN SECTION  12/19/2010   Procedure: CESAREAN SECTION;  Surgeon: Logan Bores;  Location: [facility];  Service: Gynecology;;  Primary cesarean section.   . CHOLECYSTECTOMY  12/18/2011   Procedure: LAPAROSCOPIC CHOLECYSTECTOMY;  Surgeon: Harl Bowie, MD;  Location: [place] [facility];  Service: General;  Laterality: N/A;  . LAPAROSCOPY  2006    There were no vitals filed for this visit.      Subjective Assessment - 07/31/16 0724    Subjective Traction is helping.  It is helping my neck a lot.  Not a lot of change in arm pain.     Currently in Pain? Yes   Pain Score 6    Pain Location Neck   Pain Orientation Right;Left   Pain Descriptors /  Indicators Aching;Burning   Pain Type Acute pain   Pain Onset 1 to 4 weeks ago   Pain Frequency Intermittent                         [facility] Adult PT Treatment/Exercise - 07/31/16 0001      Neck Exercises: Machines for Strengthening   UBE (Upper Arm Bike) --     Neck Exercises: Supine   Other Supine Exercise red theraband: D2, overhead flexion and ER      Modalities   Modalities Traction;Electrical Stimulation;Moist Heat     Moist Heat Therapy   Number Minutes Moist Heat 15 Minutes   Moist Heat Location Cervical;Other (comment)     Electrical Stimulation   Electrical Stimulation Location neck and bil trap and rhomboids   Electrical Stimulation Action IFC   Electrical Stimulation Parameters to pt tolerance   Electrical Stimulation Goals Pain     Traction   Type of Traction Cervical   Min (lbs) 5   Max (lbs) 17   Hold Time 60   Rest Time 10   Time 10     Manual Therapy   Manual Therapy Soft tissue mobilization;Myofascial release   Manual therapy comments soft tissue work/trigger point R > L; Upper Trap, Rhomboids bil; R posterior scalenes  PT Short Term Goals - 07/28/16 0806      PT SHORT TERM GOAL #1   Title Pt will be I with HEP for pain management   Time 3   Period Weeks   Status Achieved     PT SHORT TERM GOAL #2   Title Pt will demo improved cervical flexion to 60 degrees to assist with work duties   Baseline 55   Time 3   Period Weeks   Status Achieved     PT SHORT TERM GOAL #3   Title Pt will demo improved R lateral flexion to 40 degrees   Baseline 30   Time 3   Period Weeks   Status Achieved     PT SHORT TERM GOAL #4   Title Pt will demo improved cervical extension to 15 degrees   Time 3   Period Weeks   Status Achieved           PT Long Term Goals - 07/15/16 1358      PT LONG TERM GOAL #1   Title Pt will report overall cervical pain reduction to 3/10 with all activities   Time 6   Period  Weeks   Status New     PT LONG TERM GOAL #2   Title Pt will report 60% pain reduction in full work duties   Time 6   Period Weeks   Status New     PT LONG TERM GOAL #3   Title Pt will be able to lift 15 lbs overhead repeatedly x 10 reps without increased cervical pain   Time 6   Period Weeks   Status New     PT LONG TERM GOAL #4   Title Pt will report 75% reduction in R UE pain to assist with work duties   Time 6   Period Weeks   Status New     PT LONG TERM GOAL #5   Title Pt will demo improved FOTO score to 44% limitation present for neck pain   Time 6   Period Weeks   Status New               Plan - 07/31/16 7096    Clinical Impression Statement Pt reports 50% reduction in overall neck pain and denies any change in UE radiculopathy.  Pt will get MRI soon.  Pt reports some relief after PT yet not a long term change.  Pt will benefit from skilled PT for manual, modalities, traction. flexibility and postural strength.     Clinical Impairments Affecting Rehab Potential only wants to see a female provider; cannot lay supine for long (gets nauseous...long standing problem per pt)   PT Frequency 2x / week   PT Duration 6 weeks   PT Treatment/Interventions Electrical Stimulation;Cryotherapy;Iontophoresis 4mg /ml Dexamethasone;Moist Heat;Traction;Ultrasound;Therapeutic exercise;Therapeutic activities;Patient/family education;Manual techniques;Passive range of motion;Dry needling;Taping   PT Next Visit Plan request more w/c visits, cervical traction; see if she would like to try dry needling; no UBE due to increasing hand symptoms; try supine with head on physioball to work on cervical stabilization; check o order if signed and if not send second request   Consulted and Agree with Plan of Care Patient      Patient will benefit from skilled therapeutic intervention in order to improve the following deficits and impairments:  Decreased activity tolerance, Decreased range of  motion, Decreased mobility, Hypomobility, Increased fascial restricitons, Increased muscle spasms, Impaired flexibility, Postural dysfunction, Improper body mechanics, Pain  Visit Diagnosis: Cervicalgia  Abnormal posture  Muscle weakness (generalized)     Problem List Patient Active Problem List   Diagnosis Date Noted  . Symptomatic cholelithiasis 12/01/2011  . Carotid bruit 11/04/2010  . Palpitations 09/27/2010  . Murmur 09/27/2010  . Chest pain 09/27/2010  . SOB (shortness of breath) 09/27/2010    Sigurd Sos, PT 07/31/16 8:13 AM  [facility] [address] [place], [place], [address] Phone: [phone]   Fax:  [phone]  Name: Kathleen Cooper MRN: [id number] Date of Birth: 1980/04/24

## 2016-08-05 ENCOUNTER — Encounter: Payer: Self-pay | Admitting: Physical Therapy

## 2016-08-07 ENCOUNTER — Ambulatory Visit: Payer: PRIVATE HEALTH INSURANCE | Attending: Orthopedic Surgery

## 2016-08-07 DIAGNOSIS — R293 Abnormal posture: Secondary | ICD-10-CM | POA: Insufficient documentation

## 2016-08-07 DIAGNOSIS — M542 Cervicalgia: Secondary | ICD-10-CM | POA: Diagnosis not present

## 2016-08-07 DIAGNOSIS — M6281 Muscle weakness (generalized): Secondary | ICD-10-CM | POA: Insufficient documentation

## 2016-08-07 NOTE — Therapy (Signed)
[facility] [address] [place], [place], [address] Phone: [phone]   Fax:  [phone]  Physical Therapy Treatment  Patient Details  Name: Kathleen Cooper MRN: [id number] Date of Birth: 11-Jan-1980 Referring Provider: Phylliss Bob, MD  Encounter Date: 08/07/2016      PT End of Session - 08/07/16 0804    Visit Number 7   Date for PT Re-Evaluation 09/18/16   Authorization Type W/C 8 visits approved- [place] - Visit Number 7   Authorization - Number of Visits 8   PT Start Time [phone]   PT Stop Time 0825   PT Time Calculation (min) 56 min   Activity Tolerance Patient tolerated treatment well   Behavior During Therapy [facility] for tasks assessed/performed      Past Medical History:  Diagnosis Date  . ADHD (attention deficit hyperactivity disorder)   . Complication of anesthesia    states has been hard to wake up post-op  . Constipation 12/12/2011   chronic  . Dental crowns present   . Gallstones 12/2011  . GERD (gastroesophageal reflux disease)   . Tachycardia    hx. - had workup 09/2010  . Venous angioma of brain [facility])    states has no problems    Past Surgical History:  Procedure Laterality Date  . CESAREAN SECTION  12/19/2010   Procedure: CESAREAN SECTION;  Surgeon: Logan Bores;  Location: [facility];  Service: Gynecology;;  Primary cesarean section.   . CHOLECYSTECTOMY  12/18/2011   Procedure: LAPAROSCOPIC CHOLECYSTECTOMY;  Surgeon: Harl Bowie, MD;  Location: [place] [facility];  Service: General;  Laterality: N/A;  . LAPAROSCOPY  2006    There were no vitals filed for this visit.      Subjective Assessment - 08/07/16 0731    Subjective Pt was on vacation last week.  Pt reports that arm pain is better by 50%.  Neck is still painful.     Currently in Pain? Yes   Pain Score 6    Pain Location Neck   Pain Orientation Right   Pain Descriptors / Indicators  Aching;Burning   Pain Type Acute pain   Pain Radiating Towards Rt upper extremity to the hand   Pain Onset 1 to 4 weeks ago   Pain Frequency Intermittent   Aggravating Factors  random   Pain Relieving Factors heat, Robaxin            [facility] PT Assessment - 08/07/16 0001      Assessment   Medical Diagnosis neck pain with R UE radiculopathy     Home Environment   Living Environment Private residence     Prior Function   Level of Independence Independent     Observation/Other Assessments   Focus on Therapeutic Outcomes (FOTO)  49% limitation     AROM   Overall AROM  Deficits   AROM Assessment Site Cervical   Cervical Flexion 30   Cervical Extension 35   Cervical - Right Side Bend 35   Cervical - Left Side Bend 35   Cervical - Right Rotation 60   Cervical - Left Rotation 60     Palpation   Palpation comment R Upper Trap, R Sternocleidomastoid, R scalene, R thoracic paraspinals tight until T11, hypomobility of C4-5 and T2-3                     [facility] Adult PT Treatment/Exercise - 08/07/16 0001  Neck Exercises: Supine   Upper Extremity D2 Flexion;Theraband;20 reps  supine with foram roll   Theraband Level (UE D2) Level 2 (Red)     Shoulder Exercises: Supine   Horizontal ABduction Strengthening;Both;20 reps;Theraband   Theraband Level (Shoulder Horizontal ABduction) Level 2 (Red)     Shoulder Exercises: Standing   Other Standing Exercises standing thoracic stretch at the countertop 3x10 seconds     Modalities   Modalities Electrical Stimulation;Traction     Moist Heat Therapy   Number Minutes Moist Heat 10 Minutes   Moist Heat Location Cervical;Other (comment)  thoracic     Electrical Stimulation   Electrical Stimulation Location neck and bil trap and rhomboids   Electrical Stimulation Action IFC   Electrical Stimulation Parameters 10 minutes   Electrical Stimulation Goals Pain     Traction   Type of Traction Cervical   Min (lbs) 5   Max  (lbs) 17   Hold Time 60   Rest Time 10   Time 10     Neck Exercises: Stretches   Other Neck Stretches cervical A/ROM 3 ways 20 second hold x 3 each                  PT Short Term Goals - 08/07/16 0733      PT SHORT TERM GOAL #1   Title Pt will be I with HEP for pain management   Status Achieved     PT SHORT TERM GOAL #2   Title Pt will demo improved cervical flexion to 60 degrees to assist with work duties   Baseline 60 deg bil.    Status Achieved     PT SHORT TERM GOAL #3   Title Pt will demo improved R lateral flexion to 40 degrees   Baseline 35   Time 3   Period Weeks   Status On-going     PT SHORT TERM GOAL #4   Title Pt will demo improved cervical extension to 15 degrees   Baseline 35   Status Achieved           PT Long Term Goals - 08/07/16 0743      PT LONG TERM GOAL #1   Title Pt will report overall cervical pain reduction to 3/10 with all activities   Baseline 6/10   Time 6   Period Weeks   Status On-going     PT LONG TERM GOAL #2   Title Pt will report 60% pain reduction in full work duties   Baseline 50% improvement   Time 6   Period Weeks   Status On-going     PT LONG TERM GOAL #3   Title Pt will be able to lift 15 lbs overhead repeatedly x 10 reps without increased cervical pain   Baseline not lifting at work   Time 6   Period Weeks   Status On-going     PT LONG TERM GOAL #4   Title Pt will report 75% reduction in R UE pain to assist with work duties   Baseline 50%   Time 6   Period Weeks   Status On-going     PT LONG TERM GOAL #5   Title Pt will demo improved FOTO score to 44% limitation present for neck pain   Baseline 49%   Time 6   Period Weeks   Status On-going               Plan - 08/07/16 0745    Clinical Impression  Statement Pt with continued neck pain and intermittent Rt UE radiculopathy.  Pt reports 50% reduction in overall frequency and intensity of symptoms.  MRI is scheduled for next week.  Pt  with tension and trigger points in bil neck and will consider dry needling for next session.  Pt with improved cervical A/ROM.  Pt will continue to benefit from skilled PT for strength, flexibility and endurance.  Manual and modalites as needed.     Rehab Potential Good   Clinical Impairments Affecting Rehab Potential only wants to see a female provider; cannot lay supine for long (gets nauseous...long standing problem per pt)   PT Frequency 2x / week   PT Duration 6 weeks   PT Treatment/Interventions Electrical Stimulation;Cryotherapy;Iontophoresis 4mg /ml Dexamethasone;Moist Heat;Traction;Ultrasound;Therapeutic exercise;Therapeutic activities;Patient/family education;Manual techniques;Passive range of motion;Dry needling;Taping   PT Next Visit Plan dry needling next, see how MD appointment goes, check on W/C visits.  Continue traction.  Scapular strength.   Recommended Other Services 2nd attempt sent for initial certification, recert sent 07/11/62   Consulted and Agree with Plan of Care Patient      Patient will benefit from skilled therapeutic intervention in order to improve the following deficits and impairments:  Decreased activity tolerance, Decreased range of motion, Decreased mobility, Hypomobility, Increased fascial restricitons, Increased muscle spasms, Impaired flexibility, Postural dysfunction, Improper body mechanics, Pain  Visit Diagnosis: Cervicalgia - Plan: PT plan of care cert/re-cert  Abnormal posture - Plan: PT plan of care cert/re-cert  Muscle weakness (generalized) - Plan: PT plan of care cert/re-cert     Problem List Patient Active Problem List   Diagnosis Date Noted  . Symptomatic cholelithiasis 12/01/2011  . Carotid bruit 11/04/2010  . Palpitations 09/27/2010  . Murmur 09/27/2010  . Chest pain 09/27/2010  . SOB (shortness of breath) 09/27/2010     Sigurd Sos, PT 08/07/16 8:07 AM  [facility] [address] [place], [place], [address] Phone: [phone]   Fax:  [phone]  Name: Kathleen Cooper MRN: [id number] Date of Birth: August 04, 1980

## 2016-08-14 ENCOUNTER — Ambulatory Visit: Payer: PRIVATE HEALTH INSURANCE

## 2016-08-14 ENCOUNTER — Ambulatory Visit (HOSPITAL_COMMUNITY)
Admission: RE | Admit: 2016-08-14 | Discharge: 2016-08-14 | Disposition: A | Discharge status: Home / Self Care | Payer: PRIVATE HEALTH INSURANCE | Source: Ambulatory Visit | Attending: Orthopedic Surgery | Admitting: Orthopedic Surgery

## 2016-08-14 DIAGNOSIS — M5412 Radiculopathy, cervical region: Secondary | ICD-10-CM | POA: Insufficient documentation

## 2016-08-14 DIAGNOSIS — M50222 Other cervical disc displacement at C5-C6 level: Secondary | ICD-10-CM | POA: Insufficient documentation

## 2016-08-14 DIAGNOSIS — M542 Cervicalgia: Secondary | ICD-10-CM | POA: Diagnosis not present

## 2016-08-14 DIAGNOSIS — M4802 Spinal stenosis, cervical region: Secondary | ICD-10-CM | POA: Insufficient documentation

## 2016-08-14 DIAGNOSIS — M6281 Muscle weakness (generalized): Secondary | ICD-10-CM

## 2016-08-14 DIAGNOSIS — R293 Abnormal posture: Secondary | ICD-10-CM

## 2016-08-14 NOTE — Therapy (Signed)
[facility] [address], [place], [place], [address] Phone: [phone]   Fax:  [phone]  Physical Therapy Treatment  Patient Details  Name: Kathleen Cooper MRN: [id number] Date of Birth: [birth year] Referring Provider: Phylliss Bob, MD  Encounter Date: 08/14/2016      PT End of Session - 08/14/16 0820    Visit Number 8   Number of Visits 8   Date for PT Re-Evaluation 09/18/16   Authorization Type W/C 8 visits approved- [place] - Visit Number 8   Authorization - Number of Visits 8  dry needling   PT Start Time 0730   PT Stop Time 0829   PT Time Calculation (min) 59 min   Activity Tolerance Patient tolerated treatment well   Behavior During Therapy [facility] for tasks assessed/performed      Past Medical History:  Diagnosis Date  . ADHD (attention deficit hyperactivity disorder)   . Complication of anesthesia    states has been hard to wake up post-op  . Constipation 12/12/2011   chronic  . Dental crowns present   . Gallstones 12/2011  . GERD (gastroesophageal reflux disease)   . Tachycardia    hx. - had workup 09/2010  . Venous angioma of brain [facility])    states has no problems    Past Surgical History:  Procedure Laterality Date  . CESAREAN SECTION  12/19/2010   Procedure: CESAREAN SECTION;  Surgeon: Logan Bores;  Location: [facility];  Service: Gynecology;;  Primary cesarean section.   . CHOLECYSTECTOMY  12/18/2011   Procedure: LAPAROSCOPIC CHOLECYSTECTOMY;  Surgeon: Harl Bowie, MD;  Location: [place];  Service: General;  Laterality: N/A;  . LAPAROSCOPY  2006    There were no vitals filed for this visit.      Subjective Assessment - 08/14/16 0728    Subjective MRI is today at 2:00 and will see MD tomorrow.     Pertinent History Pt reports neck pain began 2 weeks ago at work getting a box from a shelf at work and she lost her balance. The box hit her on  the R side of the neck. Frontal headaches are occuring along bil eye pressure. Pt denies tinnitus.    Currently in Pain? Yes   Pain Score 7    Pain Location Neck   Pain Orientation Left   Pain Descriptors / Indicators Aching;Tightness;Burning   Pain Type Acute pain   Pain Onset More than a month ago   Pain Frequency Constant   Aggravating Factors  random, farily constant   Pain Relieving Factors heat, Robaxin, traction                         [facility] Adult PT Treatment/Exercise - 08/14/16 0001      Moist Heat Therapy   Number Minutes Moist Heat 10 Minutes   Moist Heat Location Cervical;Other (comment)  thoracic     Electrical Stimulation   Electrical Stimulation Location neck and bil trap and rhomboids   Electrical Stimulation Action IFC   Electrical Stimulation Parameters 10 minutes   Electrical Stimulation Goals Pain     Traction   Type of Traction Cervical   Min (lbs) 5   Max (lbs) 17   Hold Time 60   Rest Time 10   Time 15     Manual Therapy   Manual Therapy Soft tissue mobilization;Myofascial release   Manual therapy comments soft tissue  work/trigger point release to [employer], Rhomboids [employer]          Trigger Point Dry Needling - 08/14/16 0740    Consent Given? Yes   Education Handout Provided Yes   Muscles Treated Upper Body Upper trapezius;Rhomboids  cervical multifidi   Upper Trapezius Response Twitch reponse elicited;Palpable increased muscle length   Oblique Capitus Response --   Rhomboids Response Palpable increased muscle length;Twitch response elicited  Lt only              PT Education - 08/14/16 0820    Education provided Yes   Education Details DN info   Person(s) Educated Patient   Methods Explanation;Demonstration;Handout   Comprehension Verbalized understanding;Returned demonstration          PT Short Term Goals - 08/07/16 0733      PT SHORT TERM GOAL #1   Title Pt will be I with HEP for pain management    Status Achieved     PT SHORT TERM GOAL #2   Title Pt will demo improved cervical flexion to 60 degrees to assist with work duties   Baseline 60 deg bil.    Status Achieved     PT SHORT TERM GOAL #3   Title Pt will demo improved R lateral flexion to 40 degrees   Baseline 35   Time 3   Period Weeks   Status On-going     PT SHORT TERM GOAL #4   Title Pt will demo improved cervical extension to 15 degrees   Baseline 35   Status Achieved           PT Long Term Goals - 08/14/16 0739      PT LONG TERM GOAL #1   Title Pt will report overall cervical pain reduction to 3/10 with all activities   Time 6   Period Weeks   Status On-going     PT LONG TERM GOAL #2   Title Pt will report 60% pain reduction in full work duties   Baseline 50% improvement   Time 6   Period Weeks   Status On-going     PT LONG TERM GOAL #3   Title Pt will be able to lift 15 lbs overhead repeatedly x 10 reps without increased cervical pain   Baseline not lifting at work   Time 6   Period Weeks   Status On-going     PT LONG TERM GOAL #5   Title Pt will demo improved FOTO score to 44% limitation present for neck pain   Baseline 49%   Time 6   Period Weeks   Status On-going               Plan - 08/14/16 0741    Clinical Impression Statement Pt with continued neck pain and intermittent Rt UE radiculopathy.  Pt reports 50% reduction in overall frequency and intensity of symptoms.  MRI is scheduled for today.  Pt with tension and trigger points in bil neck and upper traps and demonstrated improved mobility and reduced pain after dry needling today.  Pt will continue to benefit from skilled PT for traction, manual, strength and flexiblity to improve ability to work without pain and return to lifting at work.     Rehab Potential Good   Clinical Impairments Affecting Rehab Potential only wants to see a female provider; cannot lay supine for long (gets nauseous...long standing problem per pt)   PT  Frequency 2x / week   PT Duration  6 weeks   PT Treatment/Interventions Electrical Stimulation;Cryotherapy;Iontophoresis 4mg /ml Dexamethasone;Moist Heat;Traction;Ultrasound;Therapeutic exercise;Therapeutic activities;Patient/family education;Manual techniques;Passive range of motion;Dry needling;Taping   PT Next Visit Plan assess response to dry needling,  see how MD appointment goes, check on W/C visits.  Continue traction.  Scapular strength.   Consulted and Agree with Plan of Care Patient      Patient will benefit from skilled therapeutic intervention in order to improve the following deficits and impairments:  Decreased activity tolerance, Decreased range of motion, Decreased mobility, Hypomobility, Increased fascial restricitons, Increased muscle spasms, Impaired flexibility, Postural dysfunction, Improper body mechanics, Pain  Visit Diagnosis: Cervicalgia  Abnormal posture  Muscle weakness (generalized)     Problem List Patient Active Problem List   Diagnosis Date Noted  . Symptomatic cholelithiasis 12/01/2011  . Carotid bruit 11/04/2010  . Palpitations 09/27/2010  . Murmur 09/27/2010  . Chest pain 09/27/2010  . SOB (shortness of breath) 09/27/2010    Sigurd Sos, PT 08/14/16 8:27 AM  [facility] [address], [place], [place], [address] Phone: [phone]   Fax:  [phone]  Name: Taniah Reinecke MRN: [id number] Date of Birth: [birth year]

## 2016-08-14 NOTE — Patient Instructions (Signed)

## 2016-08-21 ENCOUNTER — Ambulatory Visit: Payer: PRIVATE HEALTH INSURANCE | Attending: Orthopedic Surgery | Admitting: Physical Therapy

## 2016-08-21 DIAGNOSIS — M542 Cervicalgia: Secondary | ICD-10-CM | POA: Diagnosis present

## 2016-08-21 DIAGNOSIS — M6281 Muscle weakness (generalized): Secondary | ICD-10-CM | POA: Insufficient documentation

## 2016-08-21 DIAGNOSIS — R293 Abnormal posture: Secondary | ICD-10-CM | POA: Insufficient documentation

## 2016-08-21 NOTE — Therapy (Signed)
[facility] [address] [place], [place], [address] Phone: [phone]   Fax:  [phone]  Physical Therapy Treatment  Patient Details  Name: Kathleen Cooper MRN: [id number] Date of Birth: 1980-12-24 Referring Provider: Phylliss Bob, MD  Encounter Date: 08/21/2016      PT End of Session - 08/21/16 2105    Visit Number 9   Number of Visits 16   Date for PT Re-Evaluation 09/18/16   Authorization Type W/C 16visits approved- [place] - Visit Number 9   Authorization - Number of Visits 16   PT Start Time 0725   PT Stop Time 0825   PT Time Calculation (min) 60 min   Activity Tolerance Patient tolerated treatment well      Past Medical History:  Diagnosis Date  . ADHD (attention deficit hyperactivity disorder)   . Complication of anesthesia    states has been hard to wake up post-op  . Constipation 12/12/2011   chronic  . Dental crowns present   . Gallstones 12/2011  . GERD (gastroesophageal reflux disease)   . Tachycardia    hx. - had workup 09/2010  . Venous angioma of brain [facility])    states has no problems    Past Surgical History:  Procedure Laterality Date  . CESAREAN SECTION  12/19/2010   Procedure: CESAREAN SECTION;  Surgeon: Logan Bores;  Location: [facility];  Service: Gynecology;;  Primary cesarean section.   . CHOLECYSTECTOMY  12/18/2011   Procedure: LAPAROSCOPIC CHOLECYSTECTOMY;  Surgeon: Harl Bowie, MD;  Location: [place];  Service: General;  Laterality: N/A;  . LAPAROSCOPY  2006    There were no vitals filed for this visit.      Subjective Assessment - 08/21/16 0725    Subjective (P)  The doctor says the MRI is fine.  He ordered more PT.  Pain upper neck on left and between shoulder blades.  The needling helped last time but I was really sore for 2 days.  The doctor ordered me a TENS for home.  The traction helps too.      Pertinent History  (P)  Pt reports neck pain began 2 weeks ago at work getting a box from a shelf at work and she lost her balance. The box hit her on the R side of the neck. Frontal headaches are occuring along bil eye pressure. Pt denies tinnitus.    Currently in Pain? (P)  Yes   Pain Score (P)  6    Pain Location (P)  Neck                         [facility] Adult PT Treatment/Exercise - 08/21/16 0001      Moist Heat Therapy   Number Minutes Moist Heat 10 Minutes   Moist Heat Location Cervical;Other (comment)  thoracic     Electrical Stimulation   Electrical Stimulation Location neck and bil trap and rhomboids   Electrical Stimulation Action IFC   Electrical Stimulation Parameters 15   Electrical Stimulation Goals Pain     Traction   Min (lbs) 5   Max (lbs) 15   Hold Time 60   Rest Time 10   Time 10     Manual Therapy   Manual therapy comments soft tissue work/trigger point release to bil Upper Trap, Rhomboids Lt.          Trigger Point Dry Needling - 08/21/16 2104  Consent Given? Yes   Muscles Treated Upper Body Subscapularis  bil cervical multifidi   Upper Trapezius Response Twitch reponse elicited;Palpable increased muscle length   Rhomboids Response Palpable increased muscle length   Subscapularis Response Palpable increased muscle length                PT Short Term Goals - 08/21/16 2110      PT SHORT TERM GOAL #1   Title Pt will be I with HEP for pain management   Status Achieved     PT SHORT TERM GOAL #2   Title Pt will demo improved cervical flexion to 60 degrees to assist with work duties   Status Achieved     PT [place] #3   Title Pt will demo improved R lateral flexion to 40 degrees   Time 3   Period Weeks   Status On-going     PT SHORT TERM GOAL #4   Title Pt will demo improved cervical extension to 15 degrees   Status Achieved           PT Long Term Goals - 08/21/16 2110      PT LONG TERM GOAL #1   Title Pt will report  overall cervical pain reduction to 3/10 with all activities   Time 6   Period Weeks   Status On-going     PT LONG TERM GOAL #2   Title Pt will report 60% pain reduction in full work duties   Time 6   Period Weeks   Status On-going     PT LONG TERM GOAL #3   Title Pt will be able to lift 15 lbs overhead repeatedly x 10 reps without increased cervical pain   Time 6   Period Weeks   Status On-going     PT LONG TERM GOAL #4   Title Pt will report 75% reduction in R UE pain to assist with work duties   Time 6   Period Weeks   Status On-going     PT LONG TERM GOAL #5   Title Pt will demo improved FOTO score to 44% limitation present for neck pain   Time 6   Period Weeks   Status On-going               Plan - 08/21/16 2106    Clinical Impression Statement Patient is very sensitive to dry needling especially in cervical multifidi but reports an improvement from initial DN and would like to continue.  Improved soft tissue mobility following dry needling and manual therapy.  Also reported benefits from traction.      Rehab Potential Good   Clinical Impairments Affecting Rehab Potential only wants to see a female provider; cannot lay supine for long (gets nauseous...long standing problem per pt)   PT Frequency 2x / week   PT Duration 6 weeks   PT Treatment/Interventions Electrical Stimulation;Cryotherapy;Iontophoresis 4mg /ml Dexamethasone;Moist Heat;Traction;Ultrasound;Therapeutic exercise;Therapeutic activities;Patient/family education;Manual techniques;Passive range of motion;Dry needling;Taping   PT Next Visit Plan assess response to dry needling,  Continue traction.  Scapular strength.      Patient will benefit from skilled therapeutic intervention in order to improve the following deficits and impairments:  Decreased activity tolerance, Decreased range of motion, Decreased mobility, Hypomobility, Increased fascial restricitons, Increased muscle spasms, Impaired flexibility,  Postural dysfunction, Improper body mechanics, Pain  Visit Diagnosis: Cervicalgia  Abnormal posture  Muscle weakness (generalized)     Problem List Patient Active Problem List   Diagnosis Date Noted  .  Symptomatic cholelithiasis 12/01/2011  . Carotid bruit 11/04/2010  . Palpitations 09/27/2010  . Murmur 09/27/2010  . Chest pain 09/27/2010  . SOB (shortness of breath) 09/27/2010   Ruben Im, PT 08/21/16 9:13 PM Phone: [phone] Fax: [phone]  Alvera Singh 08/21/2016, 9:12 PM  [facility] [address], [place], [place], [address] Phone: [phone]   Fax:  [phone]  Name: Mizuki Hoel MRN: [id number] Date of Birth: 1980/03/13

## 2016-08-28 ENCOUNTER — Ambulatory Visit: Payer: PRIVATE HEALTH INSURANCE

## 2016-08-28 DIAGNOSIS — M6281 Muscle weakness (generalized): Secondary | ICD-10-CM

## 2016-08-28 DIAGNOSIS — R293 Abnormal posture: Secondary | ICD-10-CM

## 2016-08-28 DIAGNOSIS — M542 Cervicalgia: Secondary | ICD-10-CM | POA: Diagnosis not present

## 2016-08-28 NOTE — Therapy (Signed)
[facility] [address] [place], [place], [address] Phone: [phone]   Fax:  [phone]  Physical Therapy Treatment  Patient Details  Name: Kathleen Cooper MRN: [id number] Date of Birth: 1980/08/05 Referring Provider: Phylliss Bob, MD  Encounter Date: 08/28/2016      PT End of Session - 08/28/16 0756    Visit Number 10   Number of Visits 16   Date for PT Re-Evaluation 09/18/16   Authorization Type W/C 16visits approved- [place] - Visit Number 10   Authorization - Number of Visits 16   PT Start Time 0729   PT Stop Time 0826   PT Time Calculation (min) 57 min   Activity Tolerance Patient tolerated treatment well   Behavior During Therapy [facility] for tasks assessed/performed      Past Medical History:  Diagnosis Date  . ADHD (attention deficit hyperactivity disorder)   . Complication of anesthesia    states has been hard to wake up post-op  . Constipation 12/12/2011   chronic  . Dental crowns present   . Gallstones 12/2011  . GERD (gastroesophageal reflux disease)   . Tachycardia    hx. - had workup 09/2010  . Venous angioma of brain [facility])    states has no problems    Past Surgical History:  Procedure Laterality Date  . CESAREAN SECTION  12/19/2010   Procedure: CESAREAN SECTION;  Surgeon: Logan Bores;  Location: [facility];  Service: Gynecology;;  Primary cesarean section.   . CHOLECYSTECTOMY  12/18/2011   Procedure: LAPAROSCOPIC CHOLECYSTECTOMY;  Surgeon: Harl Bowie, MD;  Location: [place];  Service: General;  Laterality: N/A;  . LAPAROSCOPY  2006    There were no vitals filed for this visit.      Subjective Assessment - 08/28/16 0733    Subjective Needling helped.  Want to do needling next week.     Pertinent History next MD: 09/15/16   Currently in Pain? Yes   Pain Score 6    Pain Location Neck   Pain Orientation Left   Pain Descriptors /  Indicators Aching;Tightness;Burning   Pain Type Acute pain   Pain Onset More than a month ago   Pain Frequency Constant   Aggravating Factors  random, fairly constant   Pain Relieving Factors heat, ibuprofen, Robaxin                         [facility] Adult PT Treatment/Exercise - 08/28/16 0001      Neck Exercises: Machines for Strengthening   UBE (Upper Arm Bike) Level 1 x 6 (3/3)     Neck Exercises: Seated   Other Seated Exercise seated thoracothoracic rotation 3x20 seconds     Shoulder Exercises: Seated   Extension Strengthening;Both;20 reps;Theraband   Theraband Level (Shoulder Extension) Level 1 (Yellow)   Row Strengthening;Both;20 reps;Theraband   Theraband Level (Shoulder Row) Level 1 (Yellow)   Horizontal ABduction Strengthening;Both;20 reps;Theraband   Theraband Level (Shoulder Horizontal ABduction) Level 1 (Yellow)     Moist Heat Therapy   Number Minutes Moist Heat 15 Minutes   Moist Heat Location Cervical;Other (comment)     Electrical Stimulation   Electrical Stimulation Location neck and bil trap and rhomboids   Electrical Stimulation Action IFC   Electrical Stimulation Parameters 15   Electrical Stimulation Goals Pain     Traction   Type of Traction Cervical   Min (lbs) 5   Max (  lbs) 15   Hold Time 60   Rest Time 10   Time 10     Neck Exercises: Stretches   Other Neck Stretches cervical 3 ways 3x20 seconds                  PT Short Term Goals - 08/21/16 2110      PT SHORT TERM GOAL #1   Title Pt will be I with HEP for pain management   Status Achieved     PT SHORT TERM GOAL #2   Title Pt will demo improved cervical flexion to 60 degrees to assist with work duties   Status Achieved     PT [place] #3   Title Pt will demo improved R lateral flexion to 40 degrees   Time 3   Period Weeks   Status On-going     PT SHORT TERM GOAL #4   Title Pt will demo improved cervical extension to 15 degrees   Status Achieved            PT Long Term Goals - 08/28/16 0735      PT LONG TERM GOAL #1   Title Pt will report overall cervical pain reduction to 3/10 with all activities   Baseline 6/10   Time 6   Period Weeks   Status On-going     PT LONG TERM GOAL #2   Title Pt will report 60% pain reduction in full work duties   Baseline 60%   Status Achieved     PT LONG TERM GOAL #3   Title Pt will be able to lift 15 lbs overhead repeatedly x 10 reps without increased cervical pain   Baseline not lifting at work   [employer]   Status On-going               Plan - 08/28/16 0735    Clinical Impression Statement Pt reports 60% overall pain reduction with work activities.  Pt is responding well to dry needling and will do again next week.  Pt has been lifting lighter weight at work and reports increased pain with this task.  Pt with continued tension and trigger points in neck and upper back and requires stretch breaks after exercise.  Pt will continue to benefit from skilled PT for strength, flexibility, traction and manual therapy.     Rehab Potential Good   Clinical Impairments Affecting Rehab Potential only wants to see a female provider; cannot lay supine for long (gets nauseous...long standing problem per pt)   PT Frequency 2x / week   PT Duration 6 weeks   PT Treatment/Interventions Electrical Stimulation;Cryotherapy;Iontophoresis 4mg /ml Dexamethasone;Moist Heat;Traction;Ultrasound;Therapeutic exercise;Therapeutic activities;Patient/family education;Manual techniques;Passive range of motion;Dry needling;Taping   PT Next Visit Plan dry needling next. Continue traction.  Scapular strength.   Consulted and Agree with Plan of Care Patient      Patient will benefit from skilled therapeutic intervention in order to improve the following deficits and impairments:  Decreased activity tolerance, Decreased range of motion, Decreased mobility, Hypomobility, Increased fascial restricitons, Increased muscle  spasms, Impaired flexibility, Postural dysfunction, Improper body mechanics, Pain  Visit Diagnosis: Cervicalgia  Abnormal posture  Muscle weakness (generalized)     Problem List Patient Active Problem List   Diagnosis Date Noted  . Symptomatic cholelithiasis 12/01/2011  . Carotid bruit 11/04/2010  . Palpitations 09/27/2010  . Murmur 09/27/2010  . Chest pain 09/27/2010  . SOB (shortness of breath) 09/27/2010     Sigurd Sos, PT 08/28/16  7:59 AM  [facility] [address] [place], [place], [address] Phone: [phone]   Fax:  [phone]  Name: Kathleen Cooper MRN: [id number] Date of Birth: [birth year]

## 2016-09-02 ENCOUNTER — Ambulatory Visit: Payer: PRIVATE HEALTH INSURANCE

## 2016-09-02 DIAGNOSIS — M542 Cervicalgia: Secondary | ICD-10-CM

## 2016-09-02 DIAGNOSIS — M6281 Muscle weakness (generalized): Secondary | ICD-10-CM

## 2016-09-02 DIAGNOSIS — R293 Abnormal posture: Secondary | ICD-10-CM

## 2016-09-02 MED FILL — CYCLOBENZAPRINE 5 MG TABLET: 5 | 20 days supply | Qty: 60 | Fill #1

## 2016-09-02 MED FILL — METHOCARBAMOL 500 MG TABLET: 500 | 7 days supply | Qty: 60 | Fill #1

## 2016-09-02 NOTE — Therapy (Signed)
[facility] [address], [place], [place], [address] Phone: [phone]   Fax:  [phone]  Physical Therapy Treatment  Patient Details  Name: Kathleen Cooper MRN: [id number] Date of Birth: Apr 08, 1980 Referring Provider: Phylliss Bob, MD  Encounter Date: 09/02/2016      PT End of Session - 09/02/16 0757    Visit Number 11   Number of Visits 16   Date for PT Re-Evaluation 09/18/16   Authorization Type W/C 16visits approved- [place] - Visit Number 11   Authorization - Number of Visits 16   PT Start Time 0729   PT Stop Time 0826   PT Time Calculation (min) 57 min   Activity Tolerance Patient tolerated treatment well   Behavior During Therapy [facility] for tasks assessed/performed      Past Medical History:  Diagnosis Date  . ADHD (attention deficit hyperactivity disorder)   . Complication of anesthesia    states has been hard to wake up post-op  . Constipation 12/12/2011   chronic  . Dental crowns present   . Gallstones 12/2011  . GERD (gastroesophageal reflux disease)   . Tachycardia    hx. - had workup 09/2010  . Venous angioma of brain [facility])    states has no problems    Past Surgical History:  Procedure Laterality Date  . CESAREAN SECTION  12/19/2010   Procedure: CESAREAN SECTION;  Surgeon: Logan Bores;  Location: [place] [facility];  Service: Gynecology;;  Primary cesarean section.   . CHOLECYSTECTOMY  12/18/2011   Procedure: LAPAROSCOPIC CHOLECYSTECTOMY;  Surgeon: Harl Bowie, MD;  Location: [place];  Service: General;  Laterality: N/A;  . LAPAROSCOPY  2006    There were no vitals filed for this visit.      Subjective Assessment - 09/02/16 0733    Subjective I am better than before.     Pertinent History next MD: 09/15/16   Currently in Pain? Yes   Pain Score 5    Pain Location Neck   Pain Orientation Left   Pain Descriptors / Indicators  Aching;Tightness;Burning   Pain Type Acute pain   Pain Onset More than a month ago   Pain Frequency Constant   Aggravating Factors  random, constant   Pain Relieving Factors heat, ibuprofen, Robaxin                         [facility] Adult PT Treatment/Exercise - 09/02/16 0001      Moist Heat Therapy   Number Minutes Moist Heat 15 Minutes   Moist Heat Location Cervical;Other (comment)     Electrical Stimulation   Electrical Stimulation Location neck and bil trap and rhomboids   Electrical Stimulation Action IFC   Electrical Stimulation Parameters 15   Electrical Stimulation Goals Pain     Traction   Type of Traction Cervical   Min (lbs) 5   Max (lbs) 15   Hold Time 60   Rest Time 10   Time 10     Manual Therapy   Manual therapy comments soft tissue work/trigger point release to bil Upper Trap, Rhomboids Lt.          Trigger Point Dry Needling - 09/02/16 0735    Consent Given? Yes   Muscles Treated Upper Body Upper trapezius;Rhomboids;Subscapularis   Upper Trapezius Response Twitch reponse elicited;Palpable increased muscle length   Rhomboids Response Twitch response elicited;Palpable increased muscle length   Subscapularis Response  Twitch response elicited;Palpable increased muscle length                PT Short Term Goals - 08/21/16 2110      PT SHORT TERM GOAL #1   Title Pt will be I with HEP for pain management   Status Achieved     PT SHORT TERM GOAL #2   Title Pt will demo improved cervical flexion to 60 degrees to assist with work duties   Status Achieved     PT [place] #3   Title Pt will demo improved R lateral flexion to 40 degrees   Time 3   Period Weeks   Status On-going     PT SHORT TERM GOAL #4   Title Pt will demo improved cervical extension to 15 degrees   Status Achieved           PT Long Term Goals - 09/02/16 0755      PT LONG TERM GOAL #1   Title Pt will report overall cervical pain reduction to 3/10  with all activities   Baseline 5/10   Time 6   Period Weeks     PT LONG TERM GOAL #2   Title Pt will report 60% pain reduction in full work duties   Baseline 60%   Status Achieved     PT LONG TERM GOAL #3   Title Pt will be able to lift 15 lbs overhead repeatedly x 10 reps without increased cervical pain   Baseline not lifting at work   Time 6   Period Weeks   Status On-going               Plan - 09/02/16 0756    Clinical Impression Statement Pt with improved tissue mobility in bil. upper traps today.  Trigger points and tension/pain in Rt rhomboids and subscapularis today.  Improved mobility after dry needling today.  Pt will continue to benefit from skilled PT for postural strength, endurance and manual/modalities for pain.     Rehab Potential Good   Clinical Impairments Affecting Rehab Potential only wants to see a female provider; cannot lay supine for long (gets nauseous...long standing problem per pt)   PT Frequency 2x / week   PT Duration 6 weeks   PT Treatment/Interventions Electrical Stimulation;Cryotherapy;Iontophoresis 4mg /ml Dexamethasone;Moist Heat;Traction;Ultrasound;Therapeutic exercise;Therapeutic activities;Patient/family education;Manual techniques;Passive range of motion;Dry needling;Taping   PT Next Visit Plan traction, scapular strength, measure cervical A/ROM   Consulted and Agree with Plan of Care Patient      Patient will benefit from skilled therapeutic intervention in order to improve the following deficits and impairments:  Decreased activity tolerance, Decreased range of motion, Decreased mobility, Hypomobility, Increased fascial restricitons, Increased muscle spasms, Impaired flexibility, Postural dysfunction, Improper body mechanics, Pain  Visit Diagnosis: Cervicalgia  Abnormal posture  Muscle weakness (generalized)     Problem List Patient Active Problem List   Diagnosis Date Noted  . Symptomatic cholelithiasis 12/01/2011  . Carotid  bruit 11/04/2010  . Palpitations 09/27/2010  . Murmur 09/27/2010  . Chest pain 09/27/2010  . SOB (shortness of breath) 09/27/2010    Sigurd Sos, PT 09/02/16 7:58 AM  [facility] [address], [place], [place], [address] Phone: [phone]   Fax:  [phone]  Name: Kathleen Cooper MRN: [id number] Date of Birth: April 05, 1980

## 2016-09-04 ENCOUNTER — Ambulatory Visit: Payer: PRIVATE HEALTH INSURANCE

## 2016-09-04 DIAGNOSIS — M542 Cervicalgia: Secondary | ICD-10-CM | POA: Diagnosis not present

## 2016-09-04 DIAGNOSIS — R293 Abnormal posture: Secondary | ICD-10-CM

## 2016-09-04 DIAGNOSIS — M6281 Muscle weakness (generalized): Secondary | ICD-10-CM

## 2016-09-04 NOTE — Therapy (Signed)
[facility] [address] [place], [place], [address] Phone: [phone]   Fax:  [phone]  Physical Therapy Treatment  Patient Details  Name: Kathleen Cooper MRN: [id number] Date of Birth: 25-Feb-1980 Referring Provider: Phylliss Bob, MD  Encounter Date: 09/04/2016      PT End of Session - 09/04/16 0800    Visit Number 12   Number of Visits 16   Date for PT Re-Evaluation 09/18/16   Authorization Type W/C 16visits approved- [place] - Visit Number 12   Authorization - Number of Visits 16   PT Start Time 0731   PT Stop Time 0824   PT Time Calculation (min) 53 min   Activity Tolerance Patient tolerated treatment well   Behavior During Therapy [facility] for tasks assessed/performed      Past Medical History:  Diagnosis Date  . ADHD (attention deficit hyperactivity disorder)   . Complication of anesthesia    states has been hard to wake up post-op  . Constipation 12/12/2011   chronic  . Dental crowns present   . Gallstones 12/2011  . GERD (gastroesophageal reflux disease)   . Tachycardia    hx. - had workup 09/2010  . Venous angioma of brain [facility])    states has no problems    Past Surgical History:  Procedure Laterality Date  . CESAREAN SECTION  12/19/2010   Procedure: CESAREAN SECTION;  Surgeon: Logan Bores;  Location: [facility];  Service: Gynecology;;  Primary cesarean section.   . CHOLECYSTECTOMY  12/18/2011   Procedure: LAPAROSCOPIC CHOLECYSTECTOMY;  Surgeon: Harl Bowie, MD;  Location: [place];  Service: General;  Laterality: N/A;  . LAPAROSCOPY  2006    There were no vitals filed for this visit.      Subjective Assessment - 09/04/16 0737    Subjective Dry needling is helping me.   Pertinent History next MD: 09/15/16   Currently in Pain? Yes   Pain Score 5    Pain Orientation Left;Right   Pain Descriptors / Indicators Aching   Pain Type Acute pain             [facility] PT Assessment - 09/04/16 0001      AROM   Overall AROM  Deficits   AROM Assessment Site Cervical   Cervical Flexion 35   Cervical - Right Side Bend 50   Cervical - Left Side Bend 40   Cervical - Right Rotation 70   Cervical - Left Rotation 70                     [facility] Adult PT Treatment/Exercise - 09/04/16 0001      Neck Exercises: Machines for Strengthening   UBE (Upper Arm Bike) Level 1 x 6 (3/3)     Neck Exercises: Seated   Other Seated Exercise seated thoracothoracic rotation 3x20 seconds     Shoulder Exercises: Seated   Extension Strengthening;Both;20 reps;Theraband   Theraband Level (Shoulder Extension) Level 2 (Red)   Row Strengthening;Both;20 reps;Theraband   Theraband Level (Shoulder Row) Level 2 (Red)   Other Seated Exercises 3 way raises: 1# 2x10  verbal cues for posture.  Some Lt UE pain with this today     Moist Heat Therapy   Number Minutes Moist Heat 15 Minutes   Moist Heat Location Cervical;Other (comment)     Electrical Stimulation   Electrical Stimulation Location neck and bil trap and rhomboids   Electrical Stimulation Action  IFC   Electrical Stimulation Parameters 15   Electrical Stimulation Goals Pain     Traction   Type of Traction Cervical   Min (lbs) 5   Max (lbs) 15   Hold Time 60   Rest Time 10   Time 10     Neck Exercises: Stretches   Other Neck Stretches cervical 3 ways 3x20 seconds                  PT Short Term Goals - 08/21/16 2110      PT SHORT TERM GOAL #1   Title Pt will be I with HEP for pain management   Status Achieved     PT SHORT TERM GOAL #2   Title Pt will demo improved cervical flexion to 60 degrees to assist with work duties   Status Achieved     PT SHORT TERM GOAL #3   Title Pt will demo improved R lateral flexion to 40 degrees   Time 3   Period Weeks   Status On-going     PT SHORT TERM GOAL #4   Title Pt will demo improved cervical extension to 15 degrees    Status Achieved           PT Long Term Goals - 09/02/16 0755      PT LONG TERM GOAL #1   Title Pt will report overall cervical pain reduction to 3/10 with all activities   Baseline 5/10   Time 6   Period Weeks     PT LONG TERM GOAL #2   Title Pt will report 60% pain reduction in full work duties   Baseline 60%   Status Achieved     PT LONG TERM GOAL #3   Title Pt will be able to lift 15 lbs overhead repeatedly x 10 reps without increased cervical pain   Baseline not lifting at work   Time 6   Period Weeks   Status On-going               Plan - 09/04/16 0739    Clinical Impression Statement Pt with improved tissue mobility and reduced tension in bil neck this week.  Pt with improved cervical A/ROM in all directions. Pt tolerated strength exercises well today.  Pt reports continued improvement of symptoms at home and work.  Pt will continue to benefit from skilled PT for scapular strength, endurance, flexibility, dry needling and manual therapy.   Rehab Potential Good   PT Frequency 2x / week   PT Duration 6 weeks   PT Treatment/Interventions Electrical Stimulation;Cryotherapy;Iontophoresis 4mg /ml Dexamethasone;Moist Heat;Traction;Ultrasound;Therapeutic exercise;Therapeutic activities;Patient/family education;Manual techniques;Passive range of motion;Dry needling;Taping   PT Next Visit Plan traction, scapular strength, flexibility, dry needling   Consulted and Agree with Plan of Care Patient      Patient will benefit from skilled therapeutic intervention in order to improve the following deficits and impairments:  Decreased activity tolerance, Decreased range of motion, Decreased mobility, Hypomobility, Increased fascial restricitons, Increased muscle spasms, Impaired flexibility, Postural dysfunction, Improper body mechanics, Pain  Visit Diagnosis: Cervicalgia  Abnormal posture  Muscle weakness (generalized)     Problem List Patient Active Problem List    Diagnosis Date Noted  . Symptomatic cholelithiasis 12/01/2011  . Carotid bruit 11/04/2010  . Palpitations 09/27/2010  . Murmur 09/27/2010  . Chest pain 09/27/2010  . SOB (shortness of breath) 09/27/2010     Sigurd Sos, PT 09/04/16 8:02 AM  [facility] [address],  [place], [place], [address] Phone: [phone]   Fax:  [phone]  Name: Steffany Schoenfelder MRN: [id number] Date of Birth: [birth year]

## 2016-09-12 MED FILL — METHOCARBAMOL 500 MG TABLET: 500 | 7 days supply | Qty: 60 | Fill #0

## 2016-09-18 ENCOUNTER — Ambulatory Visit: Payer: PRIVATE HEALTH INSURANCE

## 2016-10-02 ENCOUNTER — Ambulatory Visit: Payer: PRIVATE HEALTH INSURANCE | Attending: Orthopedic Surgery

## 2016-10-02 DIAGNOSIS — M6281 Muscle weakness (generalized): Secondary | ICD-10-CM | POA: Diagnosis present

## 2016-10-02 DIAGNOSIS — R293 Abnormal posture: Secondary | ICD-10-CM | POA: Diagnosis present

## 2016-10-02 DIAGNOSIS — M542 Cervicalgia: Secondary | ICD-10-CM | POA: Diagnosis not present

## 2016-10-02 NOTE — Therapy (Signed)
[facility] [address], [place], [place], [address] Phone: [phone]   Fax:  [phone]  Physical Therapy Treatment  Patient Details  Name: Kathleen Cooper MRN: [id number] Date of Birth: 01/19/1980 Referring Provider: Phylliss Bob, MD  Encounter Date: 10/02/2016      PT End of Session - 10/02/16 0806    Visit Number 13   Number of Visits 16   Date for PT Re-Evaluation 10/30/16   Authorization Type W/C 16visits approved- [place] - Visit Number 13   Authorization - Number of Visits 16   PT Start Time 0740   PT Stop Time 0804  pt late   PT Time Calculation (min) 24 min   Activity Tolerance Patient tolerated treatment well   Behavior During Therapy [facility] for tasks assessed/performed      Past Medical History:  Diagnosis Date  . ADHD (attention deficit hyperactivity disorder)   . Complication of anesthesia    states has been hard to wake up post-op  . Constipation 12/12/2011   chronic  . Dental crowns present   . Gallstones 12/2011  . GERD (gastroesophageal reflux disease)   . Tachycardia    hx. - had workup 09/2010  . Venous angioma of brain [facility])    states has no problems    Past Surgical History:  Procedure Laterality Date  . CESAREAN SECTION  12/19/2010   Procedure: CESAREAN SECTION;  Surgeon: Logan Bores;  Location: [facility];  Service: Gynecology;;  Primary cesarean section.   . CHOLECYSTECTOMY  12/18/2011   Procedure: LAPAROSCOPIC CHOLECYSTECTOMY;  Surgeon: Harl Bowie, MD;  Location: [place];  Service: General;  Laterality: N/A;  . LAPAROSCOPY  2006    There were no vitals filed for this visit.      Subjective Assessment - 10/02/16 0739    Subjective Pt with lapse in treatment since 09/04/16.  Pt has home TENS now.     Currently in Pain? Yes   Pain Score 6    Pain Location Neck   Pain Orientation Right;Left   Pain Descriptors / Indicators  Aching   Pain Type Acute pain   Pain Onset More than a month ago   Pain Frequency Intermittent   Aggravating Factors  random   Pain Relieving Factors Robaxin, ibuprofen, heat            [facility] PT Assessment - 10/02/16 0001      Assessment   Medical Diagnosis neck pain with R UE radiculopathy     [place] residence     Prior Function   Level of Independence Independent     Observation/Other Assessments   Focus on Therapeutic Outcomes (FOTO)  32% limitation     AROM   AROM Assessment Site Cervical   Cervical Flexion 40   Cervical - Right Side Bend 45   Cervical - Left Side Bend 40   Cervical - Right Rotation 85   Cervical - Left Rotation 85                     [facility] Adult PT Treatment/Exercise - 10/02/16 0001      Neck Exercises: Machines for Strengthening   UBE (Upper Arm Bike) Level 1 x 6 (3/3)  PT gave pt FOTO      Neck Exercises: Seated   Upper Extremity D2 Flexion;20 reps;Theraband   Theraband Level (UE D2) Level 2 (Red)  Other Seated Exercise seated thoracothoracic rotation 3x20 seconds     Shoulder Exercises: Seated   Horizontal ABduction Strengthening;Both;20 reps   Other Seated Exercises 3 way raises: 1# 2x10  verbal cues for posture.                    PT Short Term Goals - 08/21/16 2110      PT SHORT TERM GOAL #1   Title Pt will be I with HEP for pain management   Status Achieved     PT SHORT TERM GOAL #2   Title Pt will demo improved cervical flexion to 60 degrees to assist with work duties   Status Achieved     PT [place] #3   Title Pt will demo improved R lateral flexion to 40 degrees   Time 3   Period Weeks   Status On-going     PT SHORT TERM GOAL #4   Title Pt will demo improved cervical extension to 15 degrees   Status Achieved           PT Long Term Goals - 10/02/16 0745      PT LONG TERM GOAL #1   Title Pt will report overall cervical pain reduction to  3/10 with all activities   Baseline up to 6/10   Time 6   Period Weeks   Status On-going     PT LONG TERM GOAL #2   Title Pt will report 60% pain reduction in full work duties   Baseline 70% overall, pt hasn't worked in 3 weeks   Time 6   Period Weeks   Status On-going     PT LONG TERM GOAL #3   Title Pt will be able to lift 15 lbs overhead repeatedly x 10 reps without increased cervical pain   Baseline pain with this, pt hasn't been at work   Time 6   Period Weeks   Status On-going     PT LONG TERM GOAL #4   Title Pt will report 75% reduction in R UE pain to assist with work duties   Baseline 50%   Time 6   Period Weeks     PT LONG TERM GOAL #5   Title Pt will demo improved FOTO score to 44% limitation present for neck pain   Baseline 32% limitation   Status Achieved               Plan - 10/02/16 0752    Clinical Impression Statement Pt with a lapse in treatment since 09/04/16.  Pt reports 70% overall improvement in neck and Rt UE symptoms.  Pt has been off work for 3 weeks and feels this rest has helped her.  Cervical A/ROM is improved in all directions.  FOTO score is improved to 32%.  Pt remains weak in scapular muscles and has difficulty with postural stabilization exercises.  Pt continues to have intermittent neck pain that she rates as 6/10 with activity and with lifting at work.  Pt will continue to benefit from skilled PT for postural strength, flexibility and manual therapy to reduce tension and pain.     Rehab Potential Good   PT Frequency 2x / week   PT Duration 4 weeks   PT Treatment/Interventions Electrical Stimulation;Cryotherapy;Iontophoresis 4mg /ml Dexamethasone;Moist Heat;Traction;Ultrasound;Therapeutic exercise;Therapeutic activities;Patient/family education;Manual techniques;Passive range of motion;Dry needling;Taping   PT Next Visit Plan traction, scapular strength, flexibility, dry needling   Consulted and Agree with Plan of Care Patient  Patient will benefit from skilled therapeutic intervention in order to improve the following deficits and impairments:  Decreased activity tolerance, Decreased range of motion, Decreased mobility, Hypomobility, Increased fascial restricitons, Increased muscle spasms, Impaired flexibility, Postural dysfunction, Improper body mechanics, Pain  Visit Diagnosis: Cervicalgia - Plan: PT plan of care cert/re-cert  Abnormal posture - Plan: PT plan of care cert/re-cert  Muscle weakness (generalized) - Plan: PT plan of care cert/re-cert     Problem List Patient Active Problem List   Diagnosis Date Noted  . Symptomatic cholelithiasis 12/01/2011  . Carotid bruit 11/04/2010  . Palpitations 09/27/2010  . Murmur 09/27/2010  . Chest pain 09/27/2010  . SOB (shortness of breath) 09/27/2010   Sigurd Sos, PT 10/02/16 8:07 AM  [facility] [address], [place], [place], [address] Phone: [phone]   Fax:  [phone]  Name: Kathleen Cooper MRN: [id number] Date of Birth: [birth year]

## 2016-10-09 ENCOUNTER — Ambulatory Visit: Payer: PRIVATE HEALTH INSURANCE | Attending: Orthopedic Surgery

## 2016-10-09 DIAGNOSIS — R293 Abnormal posture: Secondary | ICD-10-CM | POA: Diagnosis present

## 2016-10-09 DIAGNOSIS — M542 Cervicalgia: Secondary | ICD-10-CM | POA: Diagnosis not present

## 2016-10-09 DIAGNOSIS — M6281 Muscle weakness (generalized): Secondary | ICD-10-CM | POA: Insufficient documentation

## 2016-10-09 NOTE — Therapy (Signed)
[facility] [address], [place], [place], [address] Phone: [phone]   Fax:  [phone]  Physical Therapy Treatment  Patient Details  Name: Kathleen Cooper MRN: [id number] Date of Birth: August 16, 1980 Referring Provider: Phylliss Bob, MD  Encounter Date: 10/09/2016      PT End of Session - 10/09/16 0754    Visit Number 14   Number of Visits 16   Date for PT Re-Evaluation 10/30/16   Authorization Type W/C 16visits approved- [place] - Visit Number 79   Authorization - Number of Visits 16   PT Start Time 0728   PT Stop Time 0813   PT Time Calculation (min) 45 min   Activity Tolerance Patient tolerated treatment well   Behavior During Therapy [facility] for tasks assessed/performed      Past Medical History:  Diagnosis Date  . ADHD (attention deficit hyperactivity disorder)   . Complication of anesthesia    states has been hard to wake up post-op  . Constipation 12/12/2011   chronic  . Dental crowns present   . Gallstones 12/2011  . GERD (gastroesophageal reflux disease)   . Tachycardia    hx. - had workup 09/2010  . Venous angioma of brain [facility])    states has no problems    Past Surgical History:  Procedure Laterality Date  . CESAREAN SECTION  12/19/2010   Procedure: CESAREAN SECTION;  Surgeon: Logan Bores;  Location: [facility];  Service: Gynecology;;  Primary cesarean section.   . CHOLECYSTECTOMY  12/18/2011   Procedure: LAPAROSCOPIC CHOLECYSTECTOMY;  Surgeon: Harl Bowie, MD;  Location: [place];  Service: General;  Laterality: N/A;  . LAPAROSCOPY  2006    There were no vitals filed for this visit.                       [facility] Adult PT Treatment/Exercise - 10/09/16 0001      Neck Exercises: Machines for Strengthening   UBE (Upper Arm Bike) Level 1 x 6 (3/3)  PT present to discuss progress     Neck Exercises: Seated   Upper Extremity D2  Flexion;20 reps;Theraband   Theraband Level (UE D2) Level 3 (Green)   Other Seated Exercise seated thoracothoracic rotation 3x20 seconds     Shoulder Exercises: Seated   Extension Strengthening;Both;20 reps;Theraband   Theraband Level (Shoulder Extension) Level 3 (Green)   Row Strengthening;Both;20 reps;Theraband   Theraband Level (Shoulder Row) Level 3 (Green)   Horizontal ABduction Strengthening;Both;20 reps   Theraband Level (Shoulder Horizontal ABduction) Level 3 (Green)   Other Seated Exercises 3 way raises: 1# 2x10  verbal cues for posture.       Traction   Type of Traction Cervical   Min (lbs) 5   Max (lbs) 15   Hold Time 60   Rest Time 10   Time 10     Neck Exercises: Stretches   Other Neck Stretches cervical 3 ways 3x20 seconds                  PT Short Term Goals - 08/21/16 2110      PT SHORT TERM GOAL #1   Title Pt will be I with HEP for pain management   Status Achieved     PT SHORT TERM GOAL #2   Title Pt will demo improved cervical flexion to 60 degrees to assist with work duties   Status Achieved  PT SHORT TERM GOAL #3   Title Pt will demo improved R lateral flexion to 40 degrees   Time 3   Period Weeks   Status On-going     PT SHORT TERM GOAL #4   Title Pt will demo improved cervical extension to 15 degrees   Status Achieved           PT Long Term Goals - 10/09/16 0734      PT LONG TERM GOAL #2   Title Pt will report 60% pain reduction in full work duties   Status Achieved     PT LONG TERM GOAL #3   Title Pt will be able to lift 15 lbs overhead repeatedly x 10 reps without increased cervical pain   Time 6   Period Weeks   Status On-going     PT LONG TERM GOAL #4   Title Pt will report 75% reduction in R UE pain to assist with work duties   Baseline 70%   Time 6   Period Weeks   Status On-going     PT LONG TERM GOAL #5   Title Pt will demo improved FOTO score to 44% limitation present for neck pain   Status Achieved                Plan - 10/09/16 0731    Clinical Impression Statement Pt reports 70% improvement in neck and Rt UE symptoms since the start of care.  Pt was off work for 3 weeks and the rest helped her.  She returned to work on Monday.  FOTO score is improved to 32% limitation.  Pt with postural strength deficits and continues to work on improving this with exercise.  Pt with intermittent neck pain and rates the pain as 3-7/10 depending on activity.  Pt will benefit from skilled PT for 2 more sessions for cervical traction and postural/scapular activity.     PT Treatment/Interventions Electrical Stimulation;Cryotherapy;Iontophoresis 4mg /ml Dexamethasone;Moist Heat;Traction;Ultrasound;Therapeutic exercise;Therapeutic activities;Patient/family education;Manual techniques;Passive range of motion;Dry needling;Taping   PT Next Visit Plan traction, scapular strength, flexibility, see what MD says   Consulted and Agree with Plan of Care Patient      Patient will benefit from skilled therapeutic intervention in order to improve the following deficits and impairments:  Decreased activity tolerance, Decreased range of motion, Decreased mobility, Hypomobility, Increased fascial restricitons, Increased muscle spasms, Impaired flexibility, Postural dysfunction, Improper body mechanics, Pain  Visit Diagnosis: Cervicalgia  Abnormal posture  Muscle weakness (generalized)     Problem List Patient Active Problem List   Diagnosis Date Noted  . Symptomatic cholelithiasis 12/01/2011  . Carotid bruit 11/04/2010  . Palpitations 09/27/2010  . Murmur 09/27/2010  . Chest pain 09/27/2010  . SOB (shortness of breath) 09/27/2010    Sigurd Sos, PT 10/09/16 7:57 AM  [facility] [address], [place], [place], [address] Phone: [phone]   Fax:  [phone]  Name: Kathleen Cooper MRN: [id number] Date of Birth: [birth year]

## 2016-10-10 MED FILL — METHOCARBAMOL 500 MG TABLET: 500 | 7 days supply | Qty: 60 | Fill #1

## 2016-10-14 ENCOUNTER — Ambulatory Visit: Payer: PRIVATE HEALTH INSURANCE

## 2016-10-14 DIAGNOSIS — M6281 Muscle weakness (generalized): Secondary | ICD-10-CM

## 2016-10-14 DIAGNOSIS — M542 Cervicalgia: Secondary | ICD-10-CM | POA: Diagnosis not present

## 2016-10-14 DIAGNOSIS — R293 Abnormal posture: Secondary | ICD-10-CM

## 2016-10-14 NOTE — Therapy (Signed)
[facility] [address] [place], [place], [address] Phone: [phone]   Fax:  [phone]  Physical Therapy Treatment  Patient Details  Name: Kathleen Cooper MRN: [id number] Date of Birth: 30-Jun-1980 Referring Provider: Phylliss Bob, MD  Encounter Date: 10/14/2016      PT End of Session - 10/14/16 0800    Visit Number 15   Number of Visits 16   Date for PT Re-Evaluation 10/30/16   Authorization Type W/C 16visits approved- [place] - Visit Number 15   Authorization - Number of Visits 16   PT Start Time 0728   PT Stop Time 0816   PT Time Calculation (min) 48 min   Activity Tolerance Patient tolerated treatment well   Behavior During Therapy [facility] for tasks assessed/performed      Past Medical History:  Diagnosis Date  . ADHD (attention deficit hyperactivity disorder)   . Complication of anesthesia    states has been hard to wake up post-op  . Constipation 12/12/2011   chronic  . Dental crowns present   . Gallstones 12/2011  . GERD (gastroesophageal reflux disease)   . Tachycardia    hx. - had workup 09/2010  . Venous angioma of brain [facility])    states has no problems    Past Surgical History:  Procedure Laterality Date  . CESAREAN SECTION  12/19/2010   Procedure: CESAREAN SECTION;  Surgeon: Logan Bores;  Location: [facility];  Service: Gynecology;;  Primary cesarean section.   . CHOLECYSTECTOMY  12/18/2011   Procedure: LAPAROSCOPIC CHOLECYSTECTOMY;  Surgeon: Harl Bowie, MD;  Location: [facility];  Service: General;  Laterality: N/A;  . LAPAROSCOPY  2006    There were no vitals filed for this visit.      Subjective Assessment - 10/14/16 0728    Subjective Pt saw MD last week.  He lifted all work restrictions.  Pt still has pain and is OK with being done with PT this week with D/C to HEP.     Pertinent History next MD: 09/15/16   Pain Score 5    Pain  Location Neck   Pain Orientation Right;Left   Pain Descriptors / Indicators Aching   Pain Type Acute pain   Pain Onset More than a month ago   Pain Frequency Intermittent   Aggravating Factors  random, lifting   Pain Relieving Factors Robaxin, ibuprofen, heat                         [facility] Adult PT Treatment/Exercise - 10/14/16 0001      Neck Exercises: Machines for Strengthening   UBE (Upper Arm Bike) Level 1 x 6 (3/3)  PT present to discuss progress     Neck Exercises: Seated   Upper Extremity D2 Flexion;20 reps;Theraband   Theraband Level (UE D2) Level 3 (Green)   Other Seated Exercise seated thoracolumbar  rotation 3x20 seconds     Neck Exercises: Prone   Shoulder Extension 20 reps     Shoulder Exercises: Seated   Extension Strengthening;Both;20 reps;Theraband   Theraband Level (Shoulder Extension) Level 3 (Green)   Row Strengthening;Both;20 reps;Theraband   Theraband Level (Shoulder Row) Level 3 (Green)   Horizontal ABduction Strengthening;Both;20 reps   Theraband Level (Shoulder Horizontal ABduction) Level 3 (Green)   Other Seated Exercises 3 way raises: 1# 2x10  verbal cues for posture.       Traction   Type of  Traction Cervical   Min (lbs) 5   Max (lbs) 15   Hold Time 60   Rest Time 10   Time 15     Neck Exercises: Stretches   Other Neck Stretches cervical 3 ways 3x20 seconds                  PT Short Term Goals - 08/21/16 2110      PT SHORT TERM GOAL #1   Title Pt will be I with HEP for pain management   Status Achieved     PT SHORT TERM GOAL #2   Title Pt will demo improved cervical flexion to 60 degrees to assist with work duties   Status Achieved     PT [place] #3   Title Pt will demo improved R lateral flexion to 40 degrees   Time 3   Period Weeks   Status On-going     PT SHORT TERM GOAL #4   Title Pt will demo improved cervical extension to 15 degrees   Status Achieved           PT Long Term Goals  - 10/09/16 0734      PT LONG TERM GOAL #2   Title Pt will report 60% pain reduction in full work duties   Status Achieved     PT LONG TERM GOAL #3   Title Pt will be able to lift 15 lbs overhead repeatedly x 10 reps without increased cervical pain   Time 6   Period Weeks   Status On-going     PT LONG TERM GOAL #4   Title Pt will report 75% reduction in R UE pain to assist with work duties   Baseline 70%   Time 6   Period Weeks   Status On-going     PT LONG TERM GOAL #5   Title Pt will demo improved FOTO score to 44% limitation present for neck pain   Status Achieved               Plan - 10/14/16 0730    Clinical Impression Statement Pt reports 70% overall improvement in Rt UE symptoms since the start of care.  Pt has been released from MD and plans to D/C to HEP from PT this week to HEP.  Pt with continued neck pain and postural strength deficits and will continue to address these deficits with exercise after D/C.     Rehab Potential Good   Clinical Impairments Affecting Rehab Potential only wants to see a female provider; cannot lay supine for long (gets nauseous...long standing problem per pt)   PT Frequency 2x / week   PT Duration 4 weeks   PT Treatment/Interventions Electrical Stimulation;Cryotherapy;Iontophoresis 4mg /ml Dexamethasone;Moist Heat;Traction;Ultrasound;Therapeutic exercise;Therapeutic activities;Patient/family education;Manual techniques;Passive range of motion;Dry needling;Taping   PT Next Visit Plan 1 more session to finalize HEP   Consulted and Agree with Plan of Care Patient      Patient will benefit from skilled therapeutic intervention in order to improve the following deficits and impairments:  Decreased activity tolerance, Decreased range of motion, Decreased mobility, Hypomobility, Increased fascial restricitons, Increased muscle spasms, Impaired flexibility, Postural dysfunction, Improper body mechanics, Pain  Visit  Diagnosis: Cervicalgia  Abnormal posture  Muscle weakness (generalized)     Problem List Patient Active Problem List   Diagnosis Date Noted  . Symptomatic cholelithiasis 12/01/2011  . Carotid bruit 11/04/2010  . Palpitations 09/27/2010  . Murmur 09/27/2010  . Chest pain 09/27/2010  . SOB (  shortness of breath) 09/27/2010     Sigurd Sos, PT 10/14/16 8:02 AM  [facility] [address] [place], [place], [address] Phone: [phone]   Fax:  [phone]  Name: Yessenia Maillet MRN: [id number] Date of Birth: Dec 27, 1980

## 2016-10-16 ENCOUNTER — Ambulatory Visit: Payer: PRIVATE HEALTH INSURANCE

## 2016-10-16 DIAGNOSIS — M542 Cervicalgia: Secondary | ICD-10-CM

## 2016-10-16 DIAGNOSIS — R293 Abnormal posture: Secondary | ICD-10-CM

## 2016-10-16 DIAGNOSIS — M6281 Muscle weakness (generalized): Secondary | ICD-10-CM

## 2016-10-16 NOTE — Therapy (Signed)
[facility] [address] [place], [place], [address] Phone: [phone]   Fax:  [phone]  Physical Therapy Treatment  Patient Details  Name: Kathleen Cooper MRN: [id number] Date of Birth: June 09, 1980 Referring Provider: Phylliss Bob, MD  Encounter Date: 10/16/2016      PT End of Session - 10/16/16 0751    Visit Number 16   PT Start Time 0730   PT Stop Time 0809   PT Time Calculation (min) 39 min   Activity Tolerance Patient tolerated treatment well   Behavior During Therapy [facility] for tasks assessed/performed      Past Medical History:  Diagnosis Date  . ADHD (attention deficit hyperactivity disorder)   . Complication of anesthesia    states has been hard to wake up post-op  . Constipation 12/12/2011   chronic  . Dental crowns present   . Gallstones 12/2011  . GERD (gastroesophageal reflux disease)   . Tachycardia    hx. - had workup 09/2010  . Venous angioma of brain [facility])    states has no problems    Past Surgical History:  Procedure Laterality Date  . CESAREAN SECTION  12/19/2010   Procedure: CESAREAN SECTION;  Surgeon: Logan Bores;  Location: [facility];  Service: Gynecology;;  Primary cesarean section.   . CHOLECYSTECTOMY  12/18/2011   Procedure: LAPAROSCOPIC CHOLECYSTECTOMY;  Surgeon: Harl Bowie, MD;  Location: [facility];  Service: General;  Laterality: N/A;  . LAPAROSCOPY  2006    There were no vitals filed for this visit.          [facility] PT Assessment - 10/16/16 0001      Assessment   Medical Diagnosis neck pain with R UE radiculopathy     Prior Function   Level of Independence Independent     Observation/Other Assessments   Focus on Therapeutic Outcomes (FOTO)  36% limitation     AROM   AROM Assessment Site Cervical   Cervical Flexion 40   Cervical - Right Side Bend 50   Cervical - Left Side Bend 45   Cervical - Right Rotation 85   Cervical - Left  Rotation 85                     [facility] Adult PT Treatment/Exercise - 10/16/16 0001      Neck Exercises: Machines for Strengthening   UBE (Upper Arm Bike) Level 1 x 6 (3/3)  PT present to discuss progress     Neck Exercises: Seated   Upper Extremity D2 Flexion;20 reps;Theraband   Theraband Level (UE D2) Level 3 (Green)   Other Seated Exercise seated thoracolumbar  rotation 3x20 seconds     Shoulder Exercises: Seated   Extension Strengthening;Both;20 reps;Theraband   Theraband Level (Shoulder Extension) Level 3 (Green)   Row Strengthening;Both;20 reps;Theraband   Theraband Level (Shoulder Row) Level 3 (Green)   Horizontal ABduction Strengthening;Both;20 reps   Theraband Level (Shoulder Horizontal ABduction) Level 3 (Green)   Other Seated Exercises 3 way raises: 1# 2x10  verbal cues for posture.       Traction   Type of Traction Cervical   Min (lbs) 5   Max (lbs) 15   Hold Time 60   Rest Time 10   Time 15     Neck Exercises: Stretches   Other Neck Stretches cervical 3 ways 3x20 seconds  PT Short Term Goals - 08/21/16 2110      PT SHORT TERM GOAL #1   Title Pt will be I with HEP for pain management   Status Achieved     PT SHORT TERM GOAL #2   Title Pt will demo improved cervical flexion to 60 degrees to assist with work duties   Status Achieved     PT SHORT TERM GOAL #3   Title Pt will demo improved R lateral flexion to 40 degrees   Time 3   Period Weeks   Status On-going     PT SHORT TERM GOAL #4   Title Pt will demo improved cervical extension to 15 degrees   Status Achieved           PT Long Term Goals - 10/16/16 0742      PT LONG TERM GOAL #1   Title Pt will report overall cervical pain reduction to 3/10 with all activities   Baseline up to 6/10, average 5/10   Status Partially Met     PT LONG TERM GOAL #2   Title Pt will report 60% pain reduction in full work duties   Baseline 80% overall improvement    Status Achieved     PT LONG TERM GOAL #3   Title Pt will be able to lift 15 lbs overhead repeatedly x 10 reps without increased cervical pain   Baseline up to 6/10   Status Partially Met     PT LONG TERM GOAL #4   Title Pt will report 75% reduction in R UE pain to assist with work duties   Status Achieved     PT LONG TERM GOAL #5   Title Pt will demo improved FOTO score to 44% limitation present for neck pain   Baseline 36%   Status Achieved               Plan - 10/16/16 0743    Clinical Impression Statement Pt reports 80% overall improvement in Rt UE and neck symtpoms since the start of care.  FOTO is 36% limitation.  Pt with improved cervcial A/ROM to function range.  Pt reports up to 6/10 neck pain with reaching overhead and work tasks.  Pt has HEP in place to address flexibility and posutral strength.  Pt will D/C to HEP   PT Next Visit Plan D/C PT to HEP   Consulted and Agree with Plan of Care Patient      Patient will benefit from skilled therapeutic intervention in order to improve the following deficits and impairments:     Visit Diagnosis: Cervicalgia  Abnormal posture  Muscle weakness (generalized)     Problem List Patient Active Problem List   Diagnosis Date Noted  . Symptomatic cholelithiasis 12/01/2011  . Carotid bruit 11/04/2010  . Palpitations 09/27/2010  . Murmur 09/27/2010  . Chest pain 09/27/2010  . SOB (shortness of breath) 09/27/2010   PHYSICAL THERAPY DISCHARGE SUMMARY  Visits from Start of Care: 16  Current functional level related to goals / functional outcomes: Pt reports 80% overall improvement in symptoms since the start of care.  Pain up to 6/10 with work and home tasks.   Remaining deficits: See above for current status.     Education / Equipment: HEP, posture Plan: Patient agrees to discharge.  Patient goals were partially met. Patient is being discharged due to being pleased with the current functional level.  ?????         Lorrene Reid, PT 10/16/16  7:55 AM  [facility] [address] [place], [place], [address] Phone: [phone]   Fax:  [phone]  Name: Kathleen Cooper MRN: [id number] Date of Birth: 1980/01/26

## 2017-11-25 ENCOUNTER — Other Ambulatory Visit: Payer: Self-pay | Admitting: Physician Assistant

## 2017-11-25 DIAGNOSIS — N644 Mastodynia: Secondary | ICD-10-CM

## 2017-12-07 ENCOUNTER — Ambulatory Visit
Admission: RE | Admit: 2017-12-07 | Discharge: 2017-12-07 | Disposition: A | Discharge status: Home / Self Care | Payer: PRIVATE HEALTH INSURANCE | Source: Ambulatory Visit | Attending: Physician Assistant | Admitting: Physician Assistant

## 2017-12-07 ENCOUNTER — Ambulatory Visit
Admission: RE | Admit: 2017-12-07 | Discharge: 2017-12-07 | Disposition: A | Discharge status: Home / Self Care | Payer: BLUE CROSS/BLUE SHIELD | Source: Ambulatory Visit | Attending: Physician Assistant | Admitting: Physician Assistant

## 2017-12-07 DIAGNOSIS — N644 Mastodynia: Secondary | ICD-10-CM

## 2018-01-13 DIAGNOSIS — E538 Deficiency of other specified B group vitamins: Secondary | ICD-10-CM

## 2018-01-13 HISTORY — DX: Deficiency of other specified B group vitamins: E53.8

## 2019-01-19 DIAGNOSIS — M47814 Spondylosis without myelopathy or radiculopathy, thoracic region: Secondary | ICD-10-CM | POA: Insufficient documentation

## 2021-04-17 ENCOUNTER — Ambulatory Visit (INDEPENDENT_AMBULATORY_CARE_PROVIDER_SITE_OTHER): Payer: 59 | Admitting: Family

## 2021-04-17 ENCOUNTER — Encounter: Payer: Self-pay | Admitting: Family

## 2021-04-17 VITALS — BP 103/73 | HR 77 | Temp 98.0°F | Ht 62.0 in | Wt 146.0 lb

## 2021-04-17 DIAGNOSIS — R635 Abnormal weight gain: Secondary | ICD-10-CM | POA: Diagnosis not present

## 2021-04-17 DIAGNOSIS — Z020 Encounter for examination for admission to educational institution: Secondary | ICD-10-CM | POA: Diagnosis not present

## 2021-04-17 DIAGNOSIS — E538 Deficiency of other specified B group vitamins: Secondary | ICD-10-CM

## 2021-04-17 DIAGNOSIS — Z0001 Encounter for general adult medical examination with abnormal findings: Secondary | ICD-10-CM

## 2021-04-17 DIAGNOSIS — E559 Vitamin D deficiency, unspecified: Secondary | ICD-10-CM

## 2021-04-17 LAB — COMPREHENSIVE METABOLIC PANEL
ALT: 22 U/L (ref 0–35)
AST: 18 U/L (ref 0–37)
Albumin: 4.2 g/dL (ref 3.5–5.2)
Alkaline Phosphatase: 58 U/L (ref 39–117)
BUN: 10 mg/dL (ref 6–23)
CO2: 28 mEq/L (ref 19–32)
Calcium: 9.3 mg/dL (ref 8.4–10.5)
Chloride: 104 mEq/L (ref 96–112)
Creatinine, Ser: 0.74 mg/dL (ref 0.40–1.20)
GFR: 100.82 mL/min (ref >60.00)
Glucose, Bld: 84 mg/dL (ref 70–99)
Potassium: 4.4 mEq/L (ref 3.5–5.1)
Sodium: 138 mEq/L (ref 135–145)
Total Bilirubin: 0.6 mg/dL (ref 0.2–1.2)
Total Protein: 6.6 g/dL (ref 6.0–8.3)

## 2021-04-17 LAB — CBC WITH DIFFERENTIAL/PLATELET
Basophils Absolute: 0 10*3/uL (ref 0.0–0.1)
Basophils Relative: 0.6 % (ref 0.0–3.0)
Eosinophils Absolute: 0 10*3/uL (ref 0.0–0.7)
Eosinophils Relative: 0.5 % (ref 0.0–5.0)
HCT: 37.9 % (ref 36.0–46.0)
Hemoglobin: 13 g/dL (ref 12.0–15.0)
Lymphocytes Relative: 34.1 % (ref 12.0–46.0)
Lymphs Abs: 2.1 10*3/uL (ref 0.7–4.0)
MCHC: 34.3 g/dL (ref 30.0–36.0)
MCV: 90 fl (ref 78.0–100.0)
Monocytes Absolute: 0.4 10*3/uL (ref 0.1–1.0)
Monocytes Relative: 6 % (ref 3.0–12.0)
Neutro Abs: 3.7 10*3/uL (ref 1.4–7.7)
Neutrophils Relative %: 58.8 % (ref 43.0–77.0)
Platelets: 311 10*3/uL (ref 150.0–400.0)
RBC: 4.21 Mil/uL (ref 3.87–5.11)
RDW: 12.8 % (ref 11.5–15.5)
WBC: 6.2 10*3/uL (ref 4.0–10.5)

## 2021-04-17 LAB — LIPID PANEL
Cholesterol: 231 mg/dL — ABNORMAL HIGH (ref 0–200)
HDL: 58.9 mg/dL (ref >39.00)
LDL Cholesterol: 157 mg/dL — ABNORMAL HIGH (ref 0–99)
NonHDL: 171.87
Total CHOL/HDL Ratio: 4
Triglycerides: 76 mg/dL (ref 0.0–149.0)
VLDL: 15.2 mg/dL (ref 0.0–40.0)

## 2021-04-17 LAB — VITAMIN D 25 HYDROXY (VIT D DEFICIENCY, FRACTURES): VITD: 29.87 ng/mL — ABNORMAL LOW (ref 30.00–100.00)

## 2021-04-17 LAB — VITAMIN B12: Vitamin B-12: 253 pg/mL (ref 211–911)

## 2021-04-17 LAB — TSH: TSH: 1.47 u[IU]/mL (ref 0.35–5.50)

## 2021-04-17 MED ORDER — MOUNJARO 7.5 MG/0.5ML ~~LOC~~ SOAJ: SUBCUTANEOUS | 0 refills | Status: DC

## 2021-04-17 NOTE — Patient Instructions (Addendum)
Welcome to [facility]! It was a pleasure meeting you today. ? ?Go to the lab for blood work today. ?I have sent a refill of your [facility] today.  Schedule a 3 month follow up visit today so we can discuss titrating. ?As discussed, you can try Magnesium L-threonate (Magtein) or Magnesium glycinate at bedtime to help with leg cramping. Start with 1 pill and increase gradually to allow your bowels to adjust. ?Be sure your legs can move freely during the night and do some calf stretches prior to bedtime. ?Also be sure you are drinking at least 2 liters of water daily. ? ? ? ?PLEASE NOTE: ? ?If you had any LAB tests please let us know if you have not heard back within a few days. You may see your results on MyChart before we have a chance to review them but we will give you a call once they are reviewed by [place]. If we ordered any REFERRALS today, please let us know if you have not heard from their office within the next week.  ?Let us know through MyChart if you are needing REFILLS, or have your pharmacy send [place] the request. You can also use MyChart to communicate with me or any office staff. ? ?Please try these tips to maintain a healthy lifestyle: ? ?Eat most of your calories during the day when you are active. Eliminate processed foods including packaged sweets (pies, cakes, cookies), reduce intake of potatoes, white bread, white pasta, and white rice. Look for whole grain options, oat flour or almond flour. ? ?Each meal should contain half fruits/vegetables, one quarter protein, and one quarter carbs (no bigger than a computer mouse). ? ?Cut down on sweet beverages. This includes juice, soda, and sweet tea. Also watch fruit intake, though this is a healthier sweet option, it still contains natural sugar! Limit to 3 servings daily. ? ?Drink at least 1 glass of water with each meal and aim for at least 8 glasses per day ? ?Exercise at least 150 minutes every week.  ? ?

## 2021-04-17 NOTE — Progress Notes (Signed)
?Phone [phone] ?  ?Subjective:  ? ?Patient is a 41 y.o. female presenting for annual physical.   ? ?Chief Complaint  ?Patient presents with  ? Annual Exam  ?  Fasting W/ Labs, Tb blood draw for school.   ? ?Obesity: Patient complains of obesity. Patient cites health, increased physical ability, self-image as reasons for wanting to lose weight. Greatest amount of weight lost: 30 lb over the last year. ?Circumstances associated with regain of weight: stress. Successful weight loss techniques attempted: prescription appetite suppressants: Mounjaro and supervised diet program. Exercise: walking. Number of regular meals per day: 3. Number of snacking episodes per day: 3. Binge behavior?: no. Use of alcohol: average 0 drinks/week. Use of medications that may cause weight gain none.  ? ?See problem oriented charting- ?ROS- full  review of systems was completed and negative other than obesity noted above. ? ? ?The following were reviewed and entered/updated in epic: ?Past Medical History:  ?Diagnosis Date  ? ADHD (attention deficit hyperactivity disorder)   ? Arthritis   ? not sure  ? Chest pain 09/27/2010  ? Complication of anesthesia   ? states has been hard to wake up post-op  ? Constipation 12/12/2011  ? chronic  ? Dental crowns present   ? Double vision 05/30/2014  ? Last Assessment & Plan:  Formatting of this note might be different from the original. Normal reports from optho. Will refer to neurology for consideration of further evaluation  ? Fibromyalgia 06/27/2013  ? Gallstones 12/07/2011  ? Generalized anxiety disorder 10/02/2014  ? GERD (gastroesophageal reflux disease)   ? Murmur   ? Myalgia 06/27/2013  ? Palpitations 09/27/2010  ? SOB (shortness of breath) 09/27/2010  ? Tachycardia   ? hx. - had workup 09/2010  ? Venous angioma of brain ([place])   ? states has no problems  ? ?Patient Active Problem List  ? Diagnosis Date Noted  ? Abnormal weight gain 04/17/2021  ? Thoracic spondylosis 01/19/2019  ? Vitamin B12  deficiency 01/13/2018  ? Generalized anxiety disorder 10/02/2014  ? ADHD (attention deficit hyperactivity disorder) 05/30/2014  ? Venous angioma of brain ([place]) 05/30/2014  ? Fibromyalgia 06/27/2013  ? Polyarthritis 06/27/2013  ? Rheumatoid factor positive 06/27/2013  ? Vitamin D deficiency 06/15/2012  ? Symptomatic cholelithiasis 12/01/2011  ? GERD (gastroesophageal reflux disease) 10/29/2011  ? Carotid bruit 11/04/2010  ? Murmur 09/27/2010  ? ?Past Surgical History:  ?Procedure Laterality Date  ? CESAREAN SECTION  12/19/2010  ? Procedure: CESAREAN SECTION;  Surgeon: Logan Bores;  Location: [facility];  Service: Gynecology;;  Primary cesarean section.   ? CHOLECYSTECTOMY  12/18/2011  ? Procedure: LAPAROSCOPIC CHOLECYSTECTOMY;  Surgeon: Harl Bowie, MD;  Location: [facility];  Service: General;  Laterality: N/A;  ? LAPAROSCOPY  01/07/2004  ? ? ?Family History  ?Problem Relation Age of Onset  ? Hyperlipidemia Mother   ?     alive  ? Arthritis Mother   ? Diabetes Father   ? Coronary artery disease Father 30  ?     alive  ? Hypertension Father   ? ? ?Medications- reviewed and updated ?Current Outpatient Medications  ?Medication Sig Dispense Refill  ? baclofen (LIORESAL) 10 MG tablet     ? bisacodyl (DULCOLAX) 5 MG EC tablet Take 5 mg by mouth daily as needed for moderate constipation.     ? cyanocobalamin (,VITAMIN B-12,) 1000 MCG/ML injection INJECT 1ML (1000MCG) WEEKLY FOR 4 WEEKS, THEN MONTHLY.    ? ibuprofen (ADVIL,MOTRIN) 200  MG tablet Take 800 mg by mouth every 6 (six) hours as needed for fever, mild pain or moderate pain.    ? methocarbamol (ROBAXIN) 500 MG tablet Take 500 mg by mouth as needed.    ? methylphenidate 54 MG PO CR tablet Take 54 mg by mouth every morning.    ? Multiple Vitamins-Minerals (HAIR SKIN & NAILS ADVANCED) TABS     ? ondansetron (ZOFRAN-ODT) 4 MG disintegrating tablet Take 4 mg by mouth every 8 (eight) hours as needed.    ? Vitamin D, Ergocalciferol, (DRISDOL) 1.25  MG (50000 UNIT) CAPS capsule Take 50,000 Units by mouth once a week.    ? MOUNJARO 7.5 MG/0.5ML Pen SMARTSIG:7.5 Milligram(s) SUB-Q Once a Week 6 mL 0  ? ?No current facility-administered medications for this visit.  ? ? ?Allergies-reviewed and updated ?No Known Allergies ? ?Social History  ? ?Social History Narrative  ? Not on file  ? ?Objective  ?Objective:  ?BP 103/73 (BP Location: Left Arm, Patient Position: Sitting, Cuff Size: Large)   Pulse 77   Temp 98 ?F (36.7 ?C) (Temporal)   Ht '5\' 2"'$  (1.575 m)   Wt 146 lb (66.2 kg)   LMP 04/06/2021 (Exact Date)   SpO2 100%   BMI 26.70 kg/m?  ?Physical Exam ?Vitals and nursing note reviewed.  ?Constitutional:   ?   Appearance: Normal appearance.  ?HENT:  ?   Head: Normocephalic.  ?   Right Ear: Tympanic membrane normal.  ?   Left Ear: Tympanic membrane normal.  ?   Nose: Nose normal.  ?   Mouth/Throat:  ?   Mouth: Mucous membranes are moist.  ?Eyes:  ?   Pupils: Pupils are equal, round, and reactive to light.  ?Cardiovascular:  ?   Rate and Rhythm: Normal rate and regular rhythm.  ?Pulmonary:  ?   Effort: Pulmonary effort is normal.  ?   Breath sounds: Normal breath sounds.  ?Musculoskeletal:     ?   General: Normal range of motion.  ?   Cervical back: Normal range of motion.  ?Lymphadenopathy:  ?   Cervical: No cervical adenopathy.  ?Skin: ?   General: Skin is warm and dry.  ?Neurological:  ?   Mental Status: She is alert.  ?Psychiatric:     ?   Mood and Affect: Mood normal.     ?   Behavior: Behavior normal.  ? ? ?  ?Assessment and Plan  ? ?Health Maintenance counseling: ?1. Anticipatory guidance: Patient counseled regarding regular dental exams q6 months, eye exams,  avoiding smoking and second hand smoke, limiting alcohol to 1 beverage per day, no illicit drugs.   ?2. Risk factor reduction:  Advised patient of need for regular exercise and diet rich with fruits and vegetables to reduce risk of heart attack and stroke. Exercise- 3-4d/week.  ?Wt Readings from Last  3 Encounters:  ?04/17/21 146 lb (66.2 kg)  ?12/29/11 136 lb 12.8 oz (62.1 kg)  ?12/12/11 134 lb (60.8 kg)  ? ?3. Immunizations/screenings/ancillary studies ?Immunization History  ?Administered Date(s) Administered  ? Influenza Split 12/21/2010  ? Influenza, Seasonal, Injecte, Preservative Fre 09/29/2016, 10/20/2017, 10/07/2018, 11/02/2019, 10/16/2020  ? PFIZER(Purple Top)SARS-COV-2 Vaccination 08/30/2019, 09/22/2019  ? Tdap 04/29/2008, 12/21/2010, 08/01/2017  ? ?Health Maintenance Due  ?Topic Date Due  ? Hepatitis C Screening  Never done  ?  ?4. Cervical cancer screening- 2022 ?5. Breast cancer screening-  mammogram: never ?6. Colon cancer screening - N/A ?7. Skin cancer screening- advised regular sunscreen use.  Denies worrisome, changing, or new skin lesions.  ?8. Birth control/STD check- none/N/A ?9. Osteoporosis screening- N/A ?10. Smoking associated screening (lung cancer screening, AAA screen 65-75, UA)- non- smoker ?Problem List Items Addressed This Visit   ? ?  ? Other  ? Vitamin B12 deficiency  ? Relevant Medications  ? cyanocobalamin (,VITAMIN B-12,) 1000 MCG/ML injection  ? Other Relevant Orders  ? Vitamin B12  ? Vitamin D deficiency  ? Relevant Medications  ? Vitamin D, Ergocalciferol, (DRISDOL) 1.25 MG (50000 UNIT) CAPS capsule  ? Other Relevant Orders  ? Vitamin D (25 hydroxy)  ? Abnormal weight gain  ?  Chronic - pt was on Wegov[employer] thru pt assistance, but then could no longer get, switched to [facility] and getting also thru pt assistance. was established with [facility] but had to stop seeing due to insurance change. refilling Mounjaro, f/u in 3 mos. ?  ?  ? Relevant Medications  ? MOUNJARO 7.5 MG/0.5ML Pen  ? ?Other Visit Diagnoses   ? ? Encounter for general adult medical examination with abnormal findings    -  Primary  ? Relevant Orders  ? Comprehensive metabolic panel  ? TSH  ? Lipid panel  ? CBC with Differential/Platelet  ? Pre-school health examination      ? Relevant Orders  ?  QuantiFERON-TB Gold Plus  ? ?  ? ? ?Recommended follow up: Return in about 3 months (around 07/17/2021) for weight loss. ?No future appointments. ? ?Lab/Order associations:fasting ? ? ?Jeanie Sewer, NP ? ? ?

## 2021-04-17 NOTE — Assessment & Plan Note (Addendum)
Chronic - pt was on Wegovy thru pt assistance, but then could no longer get, switched to [facility] and getting also thru pt assistance. was established with [facility] but had to stop seeing due to insurance change. refilling Mounjaro, f/u in 3 mos. ?

## 2021-04-19 LAB — QUANTIFERON-TB GOLD PLUS
Mitogen-NIL: 9.42 IU/mL
NIL: 0.01 IU/mL
QuantiFERON-TB Gold Plus: NEGATIVE
TB1-NIL: 0 IU/mL
TB2-NIL: 0 IU/mL

## 2021-04-23 ENCOUNTER — Encounter: Payer: Self-pay | Admitting: Family

## 2021-04-23 NOTE — Progress Notes (Signed)
Hi Kathleen Cooper, ? ?Your glucose, electrolytes, blood count, thyroid, liver & kidney function all normal. ? ?Total cholesterol number & LDL (bad #) are high. ?Need to reduce any fried foods, alcohol, nonnutritional snacks e.g. chips/cookies,pies, cakes and candies, fatty meat (red meat), high fat dairy foods:  including cheese, milk, ice cream.  ?Increase fruits/vegetables/fiber.   ?Continue or restart an exercise routine, shooting for 8mn 5-7days per week.  ?Recommend repeating fasting lipids (no food or drink except black coffee or water after midnight) in 6 months with an office visit. ? ?Also your Vitamin B12 is normal, but Vitamin D is just slightly low - you said you are taking the weekly supplement, correct? Do you think you missed a dose recently?

## 2021-04-24 ENCOUNTER — Other Ambulatory Visit: Payer: Self-pay

## 2021-04-24 DIAGNOSIS — E559 Vitamin D deficiency, unspecified: Secondary | ICD-10-CM

## 2021-04-24 MED ORDER — VITAMIN D (ERGOCALCIFEROL) 1.25 MG (50000 UNIT) PO CAPS: 50000.0000 [IU] | ORAL_CAPSULE | ORAL | 0 refills | Status: DC

## 2021-04-24 NOTE — Telephone Encounter (Signed)
12 capsules, 90d supply, no refill, thx

## 2021-04-24 NOTE — Telephone Encounter (Signed)
Okay to send refill of high dose Vit D and how many do you want her to take and for how long? ?

## 2021-05-14 ENCOUNTER — Other Ambulatory Visit: Payer: Self-pay | Admitting: Family

## 2021-05-14 DIAGNOSIS — R635 Abnormal weight gain: Secondary | ICD-10-CM

## 2021-07-18 NOTE — Progress Notes (Signed)
Patient ID: Kathleen Cooper, female    DOB: Jan 14, 1980, 41 y.o.   MRN: [id number]  Chief Complaint  Patient presents with   Follow-up    HPI: Obesity: Patient complains of obesity. Patient cites health, increased physical ability, self-image as reasons for wanting to lose weight. Greatest amount of weight lost: 30 lb over the last year. Circumstances associated with regain of weight: stress. Successful weight loss techniques attempted: prescription appetite suppressants: Mounjaro and supervised diet program. Exercise: walking. Number of regular meals per day: 3. Number of snacking episodes per day: 3. Binge behavior?: no. Use of alcohol: average 0 drinks/week. Use of medications that may cause weight gain none.   Assessment & Plan:   Problem List Items Addressed This Visit   None   Subjective:    Outpatient Medications Prior to Visit  Medication Sig Dispense Refill   bisacodyl (DULCOLAX) 5 MG EC tablet Take 5 mg by mouth daily as needed for moderate constipation.      cyanocobalamin (,VITAMIN B-12,) 1000 MCG/ML injection INJECT 1ML (1000MCG) WEEKLY FOR 4 WEEKS, THEN MONTHLY.     ibuprofen (ADVIL,MOTRIN) 200 MG tablet Take 800 mg by mouth every 6 (six) hours as needed for fever, mild pain or moderate pain.     Magnesium Bisglycinate 100 MG TABS      methocarbamol (ROBAXIN) 500 MG tablet Take 500 mg by mouth as needed.     methylphenidate 54 MG PO CR tablet Take 54 mg by mouth every morning.     MOUNJARO 7.5 MG/0.5ML Pen INJECT 7.5 MG INTO SKIN ONCE A WEEK 2 mL 2   ondansetron (ZOFRAN-ODT) 4 MG disintegrating tablet Take 4 mg by mouth every 8 (eight) hours as needed.     Prenatal Vit-Fe Fum-FA-Omega (ONE-A-DAY WOMENS PRENATAL PO)      Vitamin D, Ergocalciferol, (DRISDOL) 1.25 MG (50000 UNIT) CAPS capsule Take 1 capsule (50,000 Units total) by mouth once a week. 12 capsule 0   baclofen (LIORESAL) 10 MG tablet      Multiple Vitamins-Minerals (HAIR SKIN & NAILS ADVANCED) TABS      No  facility-administered medications prior to visit.   Past Medical History:  Diagnosis Date   ADHD (attention deficit hyperactivity disorder)    Arthritis    not sure   Chest pain 8/67/6195   Complication of anesthesia    states has been hard to wake up post-op   Constipation 12/12/2011   chronic   Dental crowns present    Double vision 05/30/2014   Last Assessment & Plan:  Formatting of this note might be different from the original. Normal reports from optho. Will refer to neurology for consideration of further evaluation   Fibromyalgia 06/27/2013   Gallstones 12/07/2011   Generalized anxiety disorder 10/02/2014   GERD (gastroesophageal reflux disease)    Murmur    Myalgia 06/27/2013   Palpitations 09/27/2010   SOB (shortness of breath) 09/27/2010   Tachycardia    hx. - had workup 09/2010   Venous angioma of brain [facility])    states has no problems   Past Surgical History:  Procedure Laterality Date   CESAREAN SECTION  12/19/2010   Procedure: CESAREAN SECTION;  Surgeon: Logan Bores;  Location: [place] [facility];  Service: Gynecology;;  Primary cesarean section.    CHOLECYSTECTOMY  12/18/2011   Procedure: LAPAROSCOPIC CHOLECYSTECTOMY;  Surgeon: Harl Bowie, MD;  Location: [place];  Service: General;  Laterality: N/A;   LAPAROSCOPY  01/07/2004   No Known Allergies  Objective:    Physical Exam Vitals and nursing note reviewed.  Constitutional:      Appearance: Normal appearance.  Cardiovascular:     Rate and Rhythm: Normal rate and regular rhythm.  Pulmonary:     Effort: Pulmonary effort is normal.     Breath sounds: Normal breath sounds.  Musculoskeletal:        General: Normal range of motion.  Skin:    General: Skin is warm and dry.  Neurological:     Mental Status: She is alert.  Psychiatric:        Mood and Affect: Mood normal.        Behavior: Behavior normal.   BP 100/69 (BP Location: Left Arm, Patient Position: Sitting, Cuff Size: Large)    Pulse 80   Temp 98.2 F (36.8 C) (Temporal)   Ht '5\' 2"'$  (1.575 m)   Wt 136 lb 8 oz (61.9 kg)   LMP 07/17/2021 (Exact Date)   SpO2 99%   BMI 24.97 kg/m  Wt Readings from Last 3 Encounters:  07/19/21 136 lb 8 oz (61.9 kg)  04/17/21 146 lb (66.2 kg)  12/29/11 136 lb 12.8 oz (62.1 kg)       Jeanie Sewer, NP

## 2021-07-19 ENCOUNTER — Encounter: Payer: Self-pay | Admitting: Family

## 2021-07-19 ENCOUNTER — Ambulatory Visit: Payer: 59 | Admitting: Family

## 2021-07-19 ENCOUNTER — Ambulatory Visit (INDEPENDENT_AMBULATORY_CARE_PROVIDER_SITE_OTHER): Payer: 59 | Admitting: Family

## 2021-07-19 VITALS — BP 100/69 | HR 80 | Temp 98.2°F | Ht 62.0 in | Wt 136.5 lb

## 2021-07-19 DIAGNOSIS — R635 Abnormal weight gain: Secondary | ICD-10-CM | POA: Diagnosis not present

## 2021-07-19 MED ORDER — TIRZEPATIDE 10 MG/0.5ML ~~LOC~~ SOAJ: 10.0000 mg | SUBCUTANEOUS | 0 refills | Status: DC

## 2021-07-19 NOTE — Assessment & Plan Note (Signed)
   Chronic  wt down another 10lbs on Mounjaro 7.'5mg'$   increasing to '10mg'$   pt goal of 125lbs  f/u 3 mos

## 2021-07-19 NOTE — Progress Notes (Signed)
Patient ID: Kathleen Cooper, female    DOB: 1980-03-14, 41 y.o.   MRN: [id number]  Chief Complaint  Patient presents with   Follow-up    HPI: Abnormal weight gain: Patient complains of weight gain. Patient cites health, increased physical ability, self-image as reasons for wanting to lose weight. Greatest amount of weight lost: 30 lb over the last year. Circumstances associated with regain of weight: stress. Successful weight loss techniques attempted: prescription appetite suppressants: Mounjaro and supervised diet program. Exercise: walking. Number of regular meals per day: 3. Use of medications that may cause weight gain: none. Pt tolerating Mounjaro, up to 7.'5mg'$ , wt down another 10lbs.  Assessment & Plan:   Problem List Items Addressed This Visit       Other   Abnormal weight gain - Primary    Chronic wt down another 10lbs on Mounjaro 7.'5mg'$  increasing to '10mg'$  pt goal of 125lbs f/u 3 mos      Relevant Medications   tirzepatide (MOUNJARO) 10 MG/0.5ML Pen     Subjective:    Outpatient Medications Prior to Visit  Medication Sig Dispense Refill   bisacodyl (DULCOLAX) 5 MG EC tablet Take 5 mg by mouth daily as needed for moderate constipation.      cyanocobalamin (,VITAMIN B-12,) 1000 MCG/ML injection INJECT 1ML (1000MCG) WEEKLY FOR 4 WEEKS, THEN MONTHLY.     ibuprofen (ADVIL,MOTRIN) 200 MG tablet Take 800 mg by mouth every 6 (six) hours as needed for fever, mild pain or moderate pain.     Magnesium Bisglycinate 100 MG TABS      methocarbamol (ROBAXIN) 500 MG tablet Take 500 mg by mouth as needed.     methylphenidate 54 MG PO CR tablet Take 54 mg by mouth every morning.     ondansetron (ZOFRAN-ODT) 4 MG disintegrating tablet Take 4 mg by mouth every 8 (eight) hours as needed.     Prenatal Vit-Fe Fum-FA-Omega (ONE-A-DAY WOMENS PRENATAL PO)      Vitamin D, Ergocalciferol, (DRISDOL) 1.25 MG (50000 UNIT) CAPS capsule Take 1 capsule (50,000 Units total) by mouth once a week. 12  capsule 0   MOUNJARO 7.5 MG/0.5ML Pen INJECT 7.5 MG INTO SKIN ONCE A WEEK 2 mL 2   baclofen (LIORESAL) 10 MG tablet      Multiple Vitamins-Minerals (HAIR SKIN & NAILS ADVANCED) TABS      No facility-administered medications prior to visit.   Past Medical History:  Diagnosis Date   ADHD (attention deficit hyperactivity disorder)    Arthritis    not sure   Chest pain 2/70/6237   Complication of anesthesia    states has been hard to wake up post-op   Constipation 12/12/2011   chronic   Dental crowns present    Double vision 05/30/2014   Last Assessment & Plan:  Formatting of this note might be different from the original. Normal reports from optho. Will refer to neurology for consideration of further evaluation   Fibromyalgia 06/27/2013   Gallstones 12/07/2011   Generalized anxiety disorder 10/02/2014   GERD (gastroesophageal reflux disease)    Murmur    Myalgia 06/27/2013   Palpitations 09/27/2010   Rheumatoid factor positive 06/27/2013   SOB (shortness of breath) 09/27/2010   Tachycardia    hx. - had workup 09/2010   Venous angioma of brain [facility])    states has no problems   Past Surgical History:  Procedure Laterality Date   CESAREAN SECTION  12/19/2010   Procedure: CESAREAN SECTION;  Surgeon: Logan Bores;  Location: [place] [facility];  Service: Gynecology;;  Primary cesarean section.    CHOLECYSTECTOMY  12/18/2011   Procedure: LAPAROSCOPIC CHOLECYSTECTOMY;  Surgeon: Harl Bowie, MD;  Location: [place];  Service: General;  Laterality: N/A;   LAPAROSCOPY  01/07/2004   No Known Allergies    Objective:    Physical Exam Vitals and nursing note reviewed.  Constitutional:      Appearance: Normal appearance.  Cardiovascular:     Rate and Rhythm: Normal rate and regular rhythm.  Pulmonary:     Effort: Pulmonary effort is normal.     Breath sounds: Normal breath sounds.  Musculoskeletal:        General: Normal range of motion.  Skin:    General: Skin is  warm and dry.  Neurological:     Mental Status: She is alert.  Psychiatric:        Mood and Affect: Mood normal.        Behavior: Behavior normal.   BP 100/69 (BP Location: Left Arm, Patient Position: Sitting, Cuff Size: Large)   Pulse 80   Temp 98.2 F (36.8 C) (Temporal)   Ht '5\' 2"'$  (1.575 m)   Wt 136 lb 8 oz (61.9 kg)   LMP 07/17/2021 (Exact Date)   SpO2 99%   BMI 24.97 kg/m  Wt Readings from Last 3 Encounters:  07/19/21 136 lb 8 oz (61.9 kg)  04/17/21 146 lb (66.2 kg)  12/29/11 136 lb 12.8 oz (62.1 kg)       Jeanie Sewer, NP

## 2021-07-27 ENCOUNTER — Other Ambulatory Visit: Payer: Self-pay | Admitting: Family

## 2021-07-27 DIAGNOSIS — R635 Abnormal weight gain: Secondary | ICD-10-CM

## 2021-08-18 ENCOUNTER — Other Ambulatory Visit: Payer: Self-pay | Admitting: Family

## 2021-08-18 DIAGNOSIS — E559 Vitamin D deficiency, unspecified: Secondary | ICD-10-CM

## 2021-09-10 DIAGNOSIS — R69 Illness, unspecified: Secondary | ICD-10-CM | POA: Diagnosis not present

## 2021-09-17 ENCOUNTER — Emergency Department (HOSPITAL_BASED_OUTPATIENT_CLINIC_OR_DEPARTMENT_OTHER): Payer: 59

## 2021-09-17 ENCOUNTER — Ambulatory Visit (INDEPENDENT_AMBULATORY_CARE_PROVIDER_SITE_OTHER): Payer: 59 | Admitting: Physician Assistant

## 2021-09-17 ENCOUNTER — Emergency Department (HOSPITAL_BASED_OUTPATIENT_CLINIC_OR_DEPARTMENT_OTHER)
Admission: EM | Admit: 2021-09-17 | Discharge: 2021-09-17 | Disposition: A | Discharge status: Home / Self Care | Payer: 59 | Attending: Emergency Medicine | Admitting: Emergency Medicine

## 2021-09-17 ENCOUNTER — Encounter: Payer: Self-pay | Admitting: Physician Assistant

## 2021-09-17 ENCOUNTER — Encounter (HOSPITAL_BASED_OUTPATIENT_CLINIC_OR_DEPARTMENT_OTHER): Payer: Self-pay | Admitting: Obstetrics and Gynecology

## 2021-09-17 ENCOUNTER — Other Ambulatory Visit: Payer: Self-pay

## 2021-09-17 VITALS — BP 116/80 | HR 126 | Temp 98.7°F | Ht 62.0 in | Wt 133.8 lb

## 2021-09-17 DIAGNOSIS — K610 Anal abscess: Secondary | ICD-10-CM | POA: Diagnosis not present

## 2021-09-17 DIAGNOSIS — R1110 Vomiting, unspecified: Secondary | ICD-10-CM | POA: Diagnosis not present

## 2021-09-17 DIAGNOSIS — K649 Unspecified hemorrhoids: Secondary | ICD-10-CM | POA: Diagnosis not present

## 2021-09-17 DIAGNOSIS — R000 Tachycardia, unspecified: Secondary | ICD-10-CM

## 2021-09-17 DIAGNOSIS — R109 Unspecified abdominal pain: Secondary | ICD-10-CM | POA: Diagnosis not present

## 2021-09-17 LAB — COMPREHENSIVE METABOLIC PANEL
ALT: 10 U/L (ref 0–44)
AST: 13 U/L — ABNORMAL LOW (ref 15–41)
Albumin: 4.6 g/dL (ref 3.5–5.0)
Alkaline Phosphatase: 65 U/L (ref 38–126)
Anion gap: 10 (ref 5–15)
BUN: 6 mg/dL (ref 6–20)
CO2: 26 mmol/L (ref 22–32)
Calcium: 10.3 mg/dL (ref 8.9–10.3)
Chloride: 100 mmol/L (ref 98–111)
Creatinine, Ser: 0.78 mg/dL (ref 0.44–1.00)
GFR, Estimated: >60 mL/min (ref >60)
Glucose, Bld: 90 mg/dL (ref 70–99)
Potassium: 4 mmol/L (ref 3.5–5.1)
Sodium: 136 mmol/L (ref 135–145)
Total Bilirubin: 1.1 mg/dL (ref 0.3–1.2)
Total Protein: 7.6 g/dL (ref 6.5–8.1)

## 2021-09-17 LAB — CBC WITH DIFFERENTIAL/PLATELET
Abs Immature Granulocytes: 0.05 10*3/uL (ref 0.00–0.07)
Basophils Absolute: 0.1 10*3/uL (ref 0.0–0.1)
Basophils Relative: 0 %
Eosinophils Absolute: 0 10*3/uL (ref 0.0–0.5)
Eosinophils Relative: 0 %
HCT: 36.5 % (ref 36.0–46.0)
Hemoglobin: 12.7 g/dL (ref 12.0–15.0)
Immature Granulocytes: 0 %
Lymphocytes Relative: 16 %
Lymphs Abs: 2 10*3/uL (ref 0.7–4.0)
MCH: 30.5 pg (ref 26.0–34.0)
MCHC: 34.8 g/dL (ref 30.0–36.0)
MCV: 87.5 fL (ref 80.0–100.0)
Monocytes Absolute: 0.7 10*3/uL (ref 0.1–1.0)
Monocytes Relative: 6 %
Neutro Abs: 9.4 10*3/uL — ABNORMAL HIGH (ref 1.7–7.7)
Neutrophils Relative %: 78 %
Platelets: 313 10*3/uL (ref 150–400)
RBC: 4.17 MIL/uL (ref 3.87–5.11)
RDW: 11.8 % (ref 11.5–15.5)
WBC: 12.2 10*3/uL — ABNORMAL HIGH (ref 4.0–10.5)
nRBC: 0 % (ref 0.0–0.2)

## 2021-09-17 LAB — LACTIC ACID, PLASMA: Lactic Acid, Venous: 0.6 mmol/L (ref 0.5–1.9)

## 2021-09-17 MED ORDER — AMOXICILLIN-POT CLAVULANATE 875-125 MG PO TABS: 1.0000 | ORAL_TABLET | Freq: Two times a day (BID) | ORAL | 0 refills | Status: DC

## 2021-09-17 MED ORDER — ONDANSETRON HCL 4 MG/2ML IJ SOLN: 4.0000 mg | Freq: Once | INTRAMUSCULAR | Status: DC

## 2021-09-17 MED ORDER — AMOXICILLIN-POT CLAVULANATE 875-125 MG PO TABS
1.0000 | ORAL_TABLET | Freq: Once | ORAL | Status: AC
  Administered 2021-09-17: 1 via ORAL
  Filled 2021-09-17: qty 1

## 2021-09-17 MED ORDER — IOHEXOL 300 MG/ML  SOLN
75.0000 mL | Freq: Once | INTRAMUSCULAR | Status: AC | PRN
  Administered 2021-09-17: 75 mL via INTRAVENOUS

## 2021-09-17 MED ORDER — FENTANYL CITRATE PF 50 MCG/ML IJ SOSY: 50.0000 ug | PREFILLED_SYRINGE | Freq: Once | INTRAMUSCULAR | Status: DC

## 2021-09-17 NOTE — Patient Instructions (Addendum)
This appears to be an abscess involving the anorectal region, which is likely contributing to your fever. You will need to be seen and evaluated in the ED. I do recommend going to [place] ED at this time.   Located in: [facility] Address: [address] [place], [place], [place] [address] Phone: [phone]

## 2021-09-17 NOTE — ED Provider Notes (Signed)
[place] EMERGENCY DEPT Provider Note   CSN: [id number] Arrival date & time: 09/17/21  1452     History {Add pertinent medical, surgical, social history, OB history to HPI:1} Chief Complaint  Patient presents with   Abscess    Kathleen Cooper is a 41 y.o. female sent in by her doctor for relation of suspected large perianal abscess.  Patient has a past medical history of hemorrhoids and chronic constipation.  Patient states that she began having some pain in her rectal region several days ago.  She thought it was probably just a hemorrhoid.  She had some bleeding when she would make a bowel movements and she has had some constipation.  The pain has become progressively worse to the point where she has severe pain when she tries to sit on her rectum, severe pain in her lower abdomen, pain when she walks or bumps her abdomen.  Last night she also had a fever and shaking chills.  She saw her PCP who sent her in for further evaluation.  She has noted a region that is bulging near her anus.  She has no other significant past medical history including history of inflammatory bowel disease.   Abscess      Home Medications Prior to Admission medications   Medication Sig Start Date End Date Taking? Authorizing Provider  bisacodyl (DULCOLAX) 5 MG EC tablet Take 5 mg by mouth daily as needed for moderate constipation.     [provider]  cyanocobalamin (,VITAMIN B-12,) 1000 MCG/ML injection INJECT 1ML (1000MCG) WEEKLY FOR 4 WEEKS, THEN MONTHLY. 09/26/20   [provider]  ibuprofen (ADVIL,MOTRIN) 200 MG tablet Take 800 mg by mouth every 6 (six) hours as needed for fever, mild pain or moderate pain.    [provider]  Magnesium Bisglycinate 100 MG TABS  05/05/21   [provider]  methocarbamol (ROBAXIN) 500 MG tablet Take 500 mg by mouth as needed.    [provider]  methylphenidate 54 MG PO CR tablet Take 54 mg by mouth every morning.     [provider]  ondansetron (ZOFRAN-ODT) 4 MG disintegrating tablet Take 4 mg by mouth every 8 (eight) hours as needed. 11/21/20   [provider]  Prenatal Vit-Fe Fum-FA-Omega (ONE-A-DAY WOMENS PRENATAL PO)  06/06/21   [provider]  tirzepatide Darcel Bayley) 10 MG/0.5ML Pen Inject 10 mg into the skin once a week. 07/19/21   Jeanie Sewer, NP  Vitamin D, Ergocalciferol, (DRISDOL) 1.25 MG (50000 UNIT) CAPS capsule TAKE 1 CAPSULE BY MOUTH ONE TIME PER WEEK 08/19/21   Jeanie Sewer, NP      Allergies    Patient has no known allergies.    Review of Systems   Review of Systems  Physical Exam Updated Vital Signs BP 108/79 (BP Location: Right Arm)   Pulse 100   Temp 99.2 F (37.3 C) (Oral)   Resp 18   LMP 09/06/2021 (Exact Date)   SpO2 99%  Physical Exam Vitals and nursing note reviewed. Exam conducted with a chaperone present.  Constitutional:      General: She is not in acute distress.    Appearance: She is well-developed. She is not diaphoretic.  HENT:     Head: Normocephalic and atraumatic.     Right Ear: External ear normal.     Left Ear: External ear normal.     Nose: Nose normal.     Mouth/Throat:     Mouth: Mucous membranes are moist.  Eyes:  General: No scleral icterus.    Conjunctiva/sclera: Conjunctivae normal.  Cardiovascular:     Rate and Rhythm: Normal rate and regular rhythm.     Heart sounds: Normal heart sounds. No murmur heard.    No friction rub. No gallop.  Pulmonary:     Effort: Pulmonary effort is normal. No respiratory distress.     Breath sounds: Normal breath sounds.  Abdominal:     General: Bowel sounds are normal. There is no distension.     Palpations: Abdomen is soft. There is no mass.     Tenderness: There is abdominal tenderness in the suprapubic area. There is guarding and rebound.  Genitourinary:    Comments: Patient with erythema around the rectum, nonthrombosed hemorrhoid present.  She has swelling and  fluctuance that is tense at the 6:00 anal lower.  She is unable to tolerate full digital rectal examination, no overt blood noted Musculoskeletal:     Cervical back: Normal range of motion.  Skin:    General: Skin is warm and dry.  Neurological:     Mental Status: She is alert and oriented to person, place, and time.  Psychiatric:        Behavior: Behavior normal.     ED Results / Procedures / Treatments   Labs (all labs ordered are listed, but only abnormal results are displayed) Labs Reviewed  COMPREHENSIVE METABOLIC PANEL - Abnormal; Notable for the following components:      Result Value   AST 13 (*)    All other components within normal limits  CBC WITH DIFFERENTIAL/PLATELET - Abnormal; Notable for the following components:   WBC 12.2 (*)    Neutro Abs 9.4 (*)    All other components within normal limits  LACTIC ACID, PLASMA  LACTIC ACID, PLASMA    EKG None  Radiology No results found.  Procedures Procedures  {Document cardiac monitor, telemetry assessment procedure when appropriate:1}  Medications Ordered in ED Medications  fentaNYL (SUBLIMAZE) injection 50 mcg (50 mcg Intravenous Not Given 09/17/21 1725)  ondansetron (ZOFRAN) injection 4 mg (4 mg Intravenous Not Given 09/17/21 1725)    ED Course/ Medical Decision Making/ A&P                           Medical Decision Making Amount and/or Complexity of Data Reviewed Labs: ordered. Radiology: ordered.  Risk Prescription drug management.   ***  {Document critical care time when appropriate:1} {Document review of labs and clinical decision tools ie heart score, Chads2Vasc2 etc:1}  {Document your independent review of radiology images, and any outside records:1} {Document your discussion with family members, caretakers, and with consultants:1} {Document social determinants of health affecting pt's care:1} {Document your decision making why or why not admission, treatments were needed:1} Final Clinical  Impression(s) / ED Diagnoses Final diagnoses:  None    Rx / DC Orders ED Discharge Orders     None

## 2021-09-17 NOTE — Discharge Instructions (Signed)
Please take the antibiotics as prescribed.  I spoke with the general surgeon on-call tonight, they recommend that you call the clinic tomorrow and schedule follow-up.

## 2021-09-17 NOTE — Progress Notes (Signed)
Subjective:    Patient ID: Kathleen Cooper, female    DOB: [birth year], 41 y.o.   MRN: [id number]  Chief Complaint  Patient presents with   Constipation    Pt has been constipated and has to take laxatives in order to have bowel movements, even with laxatives it is still hard to have bowels move, Noticed hemorrhoid two weeks ago, and constant straining made worse and now has blood in stool. Was running fever today and also having chills and headache, has taken 800 mg ibuprofen at noon today.      HPI Patient is in today for constipation, possible hemorrhoid.  States that she has been more constipated and straining the last few weeks.  Started to notice bright red blood when she wiped yesterday and today.  Started to feel febrile, headache, chills, sweaty all over around noon today and took some ibuprofen.  Denies any abdominal pain.  No chest pain, SOB, nausea or vomiting.  She drove herself here today.  Past Medical History:  Diagnosis Date   ADHD (attention deficit hyperactivity disorder)    Arthritis    not sure   Chest pain 1/32/4401   Complication of anesthesia    states has been hard to wake up post-op   Constipation 12/12/2011   chronic   Dental crowns present    Double vision 05/30/2014   Last Assessment & Plan:  Formatting of this note might be different from the original. Normal reports from optho. Will refer to neurology for consideration of further evaluation   Fibromyalgia 06/27/2013   Gallstones 12/07/2011   Generalized anxiety disorder 10/02/2014   GERD (gastroesophageal reflux disease)    Murmur    Myalgia 06/27/2013   Palpitations 09/27/2010   Rheumatoid factor positive 06/27/2013   SOB (shortness of breath) 09/27/2010   Tachycardia    hx. 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    CHOLECYSTECTOMY  12/18/2011   Procedure: LAPAROSCOPIC CHOLECYSTECTOMY;  Surgeon: Harl Bowie, MD;  Location: [place];  Service: General;  Laterality: N/A;   LAPAROSCOPY  01/07/2004    Family History  Problem Relation Age of Onset   Hyperlipidemia Mother        alive   Arthritis Mother    Diabetes Father    Coronary artery disease Father 71       alive   Hypertension Father     Social History   Tobacco Use   Smoking status: Former    Years: 5.00    Types: Cigarettes   Smokeless tobacco: Never   Tobacco comments:    smokes hookah occ.  Vaping Use   Vaping Use: Former  Substance Use Topics   Alcohol use: No   Drug use: No     No Known Allergies  Review of Systems NEGATIVE UNLESS OTHERWISE INDICATED IN HPI      Objective:     BP 116/80 (BP Location: Left Arm)   Pulse (!) 126   Temp 98.7 F (37.1 C) (Temporal)   Ht '5\' 2"'$  (1.575 m)   Wt 133 lb 12.8 oz (60.7 kg)   LMP 09/06/2021 (Exact Date)   SpO2 98%   BMI 24.47 kg/m   Wt Readings  from Last 3 Encounters:  09/17/21 133 lb 12.8 oz (60.7 kg)  07/19/21 136 lb 8 oz (61.9 kg)  04/17/21 146 lb (66.2 kg)    BP Readings from Last 3 Encounters:  09/17/21 116/80  07/19/21 100/69  04/17/21 103/73     Physical Exam Vitals and nursing note reviewed. Exam conducted with a chaperone present.  Constitutional:      Appearance: Normal appearance. She is ill-appearing and diaphoretic.  Cardiovascular:     Rate and Rhythm: Tachycardia present.     Heart sounds: No murmur heard. Pulmonary:     Effort: Pulmonary effort is normal.     Breath sounds: Normal breath sounds. No wheezing.  Genitourinary:    Rectum: Mass (tender mass 12 o'clock position extending internally - could not fully palpate due to discomfort of patient) present.  Neurological:     Mental Status: She is alert.        Assessment & Plan:  Perianal  abscess  I am worried that patient has an anorectal abscess and early signs of systemic infection with her new fever and tachycardia.  I personally contacted ED triage nurse at [facility] and directed patient to head over there now.  Her husband is going to meet her at the office and help drive her to the emergency department.  She is understanding and agreeable with plan.   This note was prepared with assistance of Systems analyst. Occasional wrong-word or sound-a-like substitutions may have occurred due to the inherent limitations of voice recognition software.   Cassandre Oleksy M Okie Bogacz, PA-C

## 2021-09-17 NOTE — ED Triage Notes (Signed)
Patient reports to the ER for a possible perianal abscess. Patient was sent by PCP to rule out perianal abscess and possible drainage of area if appropriate.

## 2021-09-17 NOTE — ED Provider Notes (Signed)
Signout from [employer] at shift change. Briefly, patient presents for rectal pain, 2 cm perianal abscess on CT.   Plan: Discuss with general surgery.   8:41 PM Reassessment performed. Patient appears relatively comfortable.  She is willing to follow-up with surgery in outpatient clinic.  Labs and imaging personally reviewed and interpreted including: CBC with differential white count 4.2 otherwise unremarkable; CMP unremarkable; lactate normal; CT with 1.8 cm perianal abscess.   Most current vital signs reviewed and are as follows: BP 108/78   Pulse (!) 105   Temp 99.2 F (37.3 C) (Oral)   Resp 18   LMP 09/06/2021 (Exact Date)   SpO2 100%   Plan: I discussed the case with Dr. Thermon Leyland.  Will start antibiotics and have patient call surgery clinic for follow-up.  States that patient may be able to follow-up with Dr. Marcello Moores or Puja PA-C.  Patient informed of discussion.  Agreeable to follow-up plan.  She is receiving first dose of Augmentin in the emergency department.   Home treatment: Warm soaks, OTC meds, continued antibiotics.   Return and follow-up instructions: Encouraged return to ED with worsening pain, fevers.  Patient encouraged to call surgery clinic tomorrow morning to schedule follow-up appointment.   Carlisle Cater, PA-C 09/17/21 2044    Fransico Meadow, MD 09/18/21 [phone]

## 2021-09-19 ENCOUNTER — Ambulatory Visit: Payer: 59 | Admitting: Family

## 2021-09-19 DIAGNOSIS — K610 Anal abscess: Secondary | ICD-10-CM | POA: Diagnosis not present

## 2021-11-11 ENCOUNTER — Other Ambulatory Visit: Payer: Self-pay | Admitting: Family

## 2021-11-11 DIAGNOSIS — R635 Abnormal weight gain: Secondary | ICD-10-CM

## 2021-11-25 ENCOUNTER — Ambulatory Visit: Payer: Self-pay | Admitting: General Surgery

## 2021-11-25 DIAGNOSIS — K603 Anal fistula: Secondary | ICD-10-CM | POA: Diagnosis not present

## 2021-11-25 NOTE — H&P (View-Only) (Signed)
Expand All Collapse All     REFERRING PROVIDER:  Self   PROVIDER:  Monico Blitz, MD   MRN: [id number] DOB: November 26, 1980 DATE OF ENCOUNTER: 11/25/2021   Subjective    Chief Complaint: Follow-up ( PERIANAL ABSCESS)   History of Present Illness: Kathleen Cooper is a 41 y.o. female with history of chronic constipation and hemorrhoids, who presented to the ER on 09/17/21 with anal pain, abdominal pain, fever/chills. WBC 12.2. She had an area of swelling and fluctuance in the posterior midline and CT scan showed a 1.8 x 1.3 cm thick walled fluid collection suspicious for abscess.  She was discharged with Augmentin and told to follow-up with [place].  The area started draining 09/18/21.  She was still in quite a bit of discomfort at her 1 week recheck as the area had closed and was no longer draining.  A bedside I&D was performed on 09/26/21 evacuating some bloody fluid.  Rechecked the following week showed the area was healing with some mild drainage.   Review of Systems: A complete review of systems was obtained from the patient.  I have reviewed this information and discussed as appropriate with the patient.  See HPI as well for other ROS.   All other review of systems negative.     Medical History: Past Medical History  History reviewed. No pertinent past medical history.     There is no problem list on file for this patient.     Past Surgical History       Past Surgical History:  Procedure Laterality Date   CESAREAN SECTION       CHOLECYSTECTOMY            Allergies       Allergies  Allergen Reactions   Duloxetine Hcl Unknown      Fatigue   Gabapentin Unknown      fatigue              Current Outpatient Medications on File Prior to Visit  Medication Sig Dispense Refill   ergocalciferol, vitamin D2, 1,250 mcg (50,000 unit) capsule Take 1 capsule by mouth every 7 (seven) days       ibuprofen (MOTRIN) 200 MG tablet Take by mouth       magnesium 200 mg Take by mouth        ondansetron (ZOFRAN) 4 MG tablet TAKE ONE TABLET BY MOUTH DAILY AS NEEDED FOR NAUSEA.       prenatal vitamin-iron-FA (PRENATE PLUS) tablet Take 1 tablet by mouth once daily       tirzepatide 10 mg/0.5 mL PnIj Inject subcutaneously        No current facility-administered medications on file prior to visit.      Family History       Family History  Problem Relation Age of Onset   Hyperlipidemia (Elevated cholesterol) Father     High blood pressure (Hypertension) Father     Diabetes Father     High blood pressure (Hypertension) Sister     Hyperlipidemia (Elevated cholesterol) Sister     High blood pressure (Hypertension) Brother     Hyperlipidemia (Elevated cholesterol) Brother          Social History       Tobacco Use  Smoking Status Never  Smokeless Tobacco Not on file      Social History  Social History        Socioeconomic History   Marital status: Married  Tobacco Use   Smoking status: Never  Substance and Sexual Activity   Alcohol use: Not Currently   Drug use: Never        Objective:    BP 108/60   Pulse 70   Temp 36.8 C (98.3 F)   Ht 157.5 cm ('5\' 2"'$ )   Wt 61.2 kg (135 lb)   BMI 24.69 kg/m  Body mass index is 24.69 kg/m.   General: Well-developed, well-nourished, in no acute distress.   Eyes: No scleral icterus.  Pupils equal, lids normal Respiratory: Normal effort, no use of accessory muscles Musculoskeletal: Normal gait. Grossly normal ROM upper and lower extremities  Psychiatric: Normal judgement and insight.  Normal mood and affect.  Alert, oriented x 3   Rectal: I&D site in the posterior midline without surrounding erythema.  Purulence drains when palpated on the left side.   Assessment and Plan:    Diagnoses and all orders for this visit:   Anal fistula       41 year old female who who developed a perianal abscess at posterior midline in September.  She was treated with antibiotics and eventually an incision and drainage was  performed later that month.  Follow-up reveals healing of everything except for a small open cavity at posterior midline that is draining purulence.  I think this is most consistent with an anal fistula.  I have recommended exam under anesthesia with possible seton placement versus fistulotomy depending on the depth of the fistula tract.  Patient does have some chronic constipation and takes medication for this.  Risk of surgery include bleeding, recurrence and pain.  There is a small chance of incontinence if too much muscle is transected during fistulotomy.       Rosario Adie, MD Colon and Rectal Surgery [facility]

## 2021-11-25 NOTE — H&P (Signed)
Expand All Collapse All     REFERRING PROVIDER:  Self   PROVIDER:  Monico Blitz, MD   MRN: [id number] DOB: 13-Jul-1980 DATE OF ENCOUNTER: 11/25/2021   Subjective    Chief Complaint: Follow-up ( PERIANAL ABSCESS)   History of Present Illness: Kathleen Cooper is a 41 y.o. female with history of chronic constipation and hemorrhoids, who presented to the ER on 09/17/21 with anal pain, abdominal pain, fever/chills. WBC 12.2. She had an area of swelling and fluctuance in the posterior midline and CT scan showed a 1.8 x 1.3 cm thick walled fluid collection suspicious for abscess.  She was discharged with Augmentin and told to follow-up with [place].  The area started draining 09/18/21.  She was still in quite a bit of discomfort at her 1 week recheck as the area had closed and was no longer draining.  A bedside I&D was performed on 09/26/21 evacuating some bloody fluid.  Rechecked the following week showed the area was healing with some mild drainage.   Review of Systems: A complete review of systems was obtained from the patient.  I have reviewed this information and discussed as appropriate with the patient.  See HPI as well for other ROS.   All other review of systems negative.     Medical History: Past Medical History  History reviewed. No pertinent past medical history.     There is no problem list on file for this patient.     Past Surgical History       Past Surgical History:  Procedure Laterality Date   CESAREAN SECTION       CHOLECYSTECTOMY            Allergies       Allergies  Allergen Reactions   Duloxetine Hcl Unknown      Fatigue   Gabapentin Unknown      fatigue              Current Outpatient Medications on File Prior to Visit  Medication Sig Dispense Refill   ergocalciferol, vitamin D2, 1,250 mcg (50,000 unit) capsule Take 1 capsule by mouth every 7 (seven) days       ibuprofen (MOTRIN) 200 MG tablet Take by mouth       magnesium 200 mg Take by mouth        ondansetron (ZOFRAN) 4 MG tablet TAKE ONE TABLET BY MOUTH DAILY AS NEEDED FOR NAUSEA.       prenatal vitamin-iron-FA (PRENATE PLUS) tablet Take 1 tablet by mouth once daily       tirzepatide 10 mg/0.5 mL PnIj Inject subcutaneously        No current facility-administered medications on file prior to visit.      Family History       Family History  Problem Relation Age of Onset   Hyperlipidemia (Elevated cholesterol) Father     High blood pressure (Hypertension) Father     Diabetes Father     High blood pressure (Hypertension) Sister     Hyperlipidemia (Elevated cholesterol) Sister     High blood pressure (Hypertension) Brother     Hyperlipidemia (Elevated cholesterol) Brother          Social History       Tobacco Use  Smoking Status Never  Smokeless Tobacco Not on file      Social History  Social History        Socioeconomic History   Marital status: Married  Tobacco Use   Smoking status: Never  Substance and Sexual Activity   Alcohol use: Not Currently   Drug use: Never        Objective:    BP 108/60   Pulse 70   Temp 36.8 C (98.3 F)   Ht 157.5 cm ('5\' 2"'$ )   Wt 61.2 kg (135 lb)   BMI 24.69 kg/m  Body mass index is 24.69 kg/m.   General: Well-developed, well-nourished, in no acute distress.   Eyes: No scleral icterus.  Pupils equal, lids normal Respiratory: Normal effort, no use of accessory muscles Musculoskeletal: Normal gait. Grossly normal ROM upper and lower extremities  Psychiatric: Normal judgement and insight.  Normal mood and affect.  Alert, oriented x 3   Rectal: I&D site in the posterior midline without surrounding erythema.  Purulence drains when palpated on the left side.   Assessment and Plan:    Diagnoses and all orders for this visit:   Anal fistula       41 year old female who who developed a perianal abscess at posterior midline in September.  She was treated with antibiotics and eventually an incision and drainage was  performed later that month.  Follow-up reveals healing of everything except for a small open cavity at posterior midline that is draining purulence.  I think this is most consistent with an anal fistula.  I have recommended exam under anesthesia with possible seton placement versus fistulotomy depending on the depth of the fistula tract.  Patient does have some chronic constipation and takes medication for this.  Risk of surgery include bleeding, recurrence and pain.  There is a small chance of incontinence if too much muscle is transected during fistulotomy.       Rosario Adie, MD Colon and Rectal Surgery [facility]

## 2021-12-09 DIAGNOSIS — F902 Attention-deficit hyperactivity disorder, combined type: Secondary | ICD-10-CM | POA: Diagnosis not present

## 2021-12-17 ENCOUNTER — Ambulatory Visit (HOSPITAL_BASED_OUTPATIENT_CLINIC_OR_DEPARTMENT_OTHER): Admit: 2021-12-17 | Payer: 59 | Admitting: General Surgery

## 2021-12-17 ENCOUNTER — Encounter (HOSPITAL_BASED_OUTPATIENT_CLINIC_OR_DEPARTMENT_OTHER): Payer: Self-pay

## 2021-12-17 SURGERY — EXAM UNDER ANESTHESIA, RECTUM
Anesthesia: General

## 2021-12-18 ENCOUNTER — Encounter (HOSPITAL_BASED_OUTPATIENT_CLINIC_OR_DEPARTMENT_OTHER): Payer: Self-pay | Admitting: General Surgery

## 2021-12-18 NOTE — Progress Notes (Signed)
Spoke w/ via phone for pre-op interview--- Ty Lab needs dos----   UPT            Lab results------ COVID test -----patient states asymptomatic no test needed Arrive at -------1400 NPO after MN NO Solid Food.  Clear liquids from MN until---1300 Med rec completed Medications to take morning of surgery -----NONE Diabetic medication ----- Patient instructed no nail polish to be worn day of surgery Patient instructed to bring photo id and insurance card day of surgery Patient aware to have Driver (ride ) / caregiver  Daughter Zena  for 24 hours after surgery  Patient Special Instructions ----- Patient does take Mounjarno, normal administration day is Saturday. Patient last dose 12/14/21. Pre-Op special Istructions ----- Patient verbalized understanding of instructions that were given at this phone interview. Patient denies shortness of breath, chest pain, fever, cough at this phone interview.

## 2021-12-20 ENCOUNTER — Encounter (HOSPITAL_BASED_OUTPATIENT_CLINIC_OR_DEPARTMENT_OTHER)
Admission: RE | Disposition: A | Discharge status: Home / Self Care | Payer: Self-pay | Source: Home / Self Care | Attending: General Surgery

## 2021-12-20 ENCOUNTER — Ambulatory Visit (HOSPITAL_BASED_OUTPATIENT_CLINIC_OR_DEPARTMENT_OTHER): Payer: Medicaid Other | Admitting: Anesthesiology

## 2021-12-20 ENCOUNTER — Other Ambulatory Visit: Payer: Self-pay

## 2021-12-20 ENCOUNTER — Encounter (HOSPITAL_BASED_OUTPATIENT_CLINIC_OR_DEPARTMENT_OTHER): Payer: Self-pay | Admitting: General Surgery

## 2021-12-20 ENCOUNTER — Ambulatory Visit (HOSPITAL_BASED_OUTPATIENT_CLINIC_OR_DEPARTMENT_OTHER)
Admission: RE | Admit: 2021-12-20 | Discharge: 2021-12-20 | Disposition: A | Discharge status: Home / Self Care | Payer: Medicaid Other | Attending: General Surgery | Admitting: General Surgery

## 2021-12-20 DIAGNOSIS — K5909 Other constipation: Secondary | ICD-10-CM | POA: Diagnosis not present

## 2021-12-20 DIAGNOSIS — Z7985 Long-term (current) use of injectable non-insulin antidiabetic drugs: Secondary | ICD-10-CM | POA: Diagnosis not present

## 2021-12-20 DIAGNOSIS — K603 Anal fistula: Secondary | ICD-10-CM | POA: Insufficient documentation

## 2021-12-20 DIAGNOSIS — Z87891 Personal history of nicotine dependence: Secondary | ICD-10-CM | POA: Insufficient documentation

## 2021-12-20 DIAGNOSIS — Z01818 Encounter for other preprocedural examination: Secondary | ICD-10-CM

## 2021-12-20 HISTORY — PX: EVALUATION UNDER ANESTHESIA WITH ANAL FISTULECTOMY: SHX5621

## 2021-12-20 HISTORY — DX: Other specified postprocedural states: Z98.890

## 2021-12-20 LAB — POCT PREGNANCY, URINE: Preg Test, Ur: NEGATIVE

## 2021-12-20 SURGERY — EXAM UNDER ANESTHESIA WITH ANAL FISTULECTOMY
Anesthesia: General

## 2021-12-20 MED ORDER — PROPOFOL 10 MG/ML IV BOLUS
INTRAVENOUS | Status: AC
  Filled 2021-12-20: qty 20

## 2021-12-20 MED ORDER — MEPERIDINE HCL 25 MG/ML IJ SOLN: 6.2500 mg | INTRAMUSCULAR | Status: DC | PRN

## 2021-12-20 MED ORDER — MIDAZOLAM HCL 2 MG/2ML IJ SOLN
INTRAMUSCULAR | Status: AC
  Filled 2021-12-20: qty 2

## 2021-12-20 MED ORDER — BUPIVACAINE-EPINEPHRINE 0.5% -1:200000 IJ SOLN
INTRAMUSCULAR | Status: DC | PRN
  Administered 2021-12-20: 30 mL

## 2021-12-20 MED ORDER — EPHEDRINE 5 MG/ML INJ
INTRAVENOUS | Status: AC
  Filled 2021-12-20: qty 5

## 2021-12-20 MED ORDER — PHENYLEPHRINE HCL (PRESSORS) 10 MG/ML IV SOLN
INTRAVENOUS | Status: DC | PRN
  Administered 2021-12-20: 80 ug via INTRAVENOUS
  Administered 2021-12-20 (×2): 160 ug via INTRAVENOUS

## 2021-12-20 MED ORDER — DEXAMETHASONE SODIUM PHOSPHATE 10 MG/ML IJ SOLN
INTRAMUSCULAR | Status: DC | PRN
  Administered 2021-12-20: 5 mg via INTRAVENOUS

## 2021-12-20 MED ORDER — PHENYLEPHRINE 80 MCG/ML (10ML) SYRINGE FOR IV PUSH (FOR BLOOD PRESSURE SUPPORT)
PREFILLED_SYRINGE | INTRAVENOUS | Status: AC
  Filled 2021-12-20: qty 10

## 2021-12-20 MED ORDER — ROCURONIUM BROMIDE 10 MG/ML (PF) SYRINGE
PREFILLED_SYRINGE | INTRAVENOUS | Status: AC
  Filled 2021-12-20: qty 10

## 2021-12-20 MED ORDER — LACTATED RINGERS IV SOLN: INTRAVENOUS | Status: DC

## 2021-12-20 MED ORDER — FENTANYL CITRATE (PF) 100 MCG/2ML IJ SOLN
INTRAMUSCULAR | Status: DC | PRN
  Administered 2021-12-20 (×2): 50 ug via INTRAVENOUS

## 2021-12-20 MED ORDER — PROPOFOL 10 MG/ML IV BOLUS
INTRAVENOUS | Status: DC | PRN
  Administered 2021-12-20: 130 mg via INTRAVENOUS

## 2021-12-20 MED ORDER — SUGAMMADEX SODIUM 200 MG/2ML IV SOLN
INTRAVENOUS | Status: DC | PRN
  Administered 2021-12-20: 200 mg via INTRAVENOUS

## 2021-12-20 MED ORDER — ACETAMINOPHEN 500 MG PO TABS
1000.0000 mg | ORAL_TABLET | ORAL | Status: AC
  Administered 2021-12-20: 1000 mg via ORAL

## 2021-12-20 MED ORDER — ACETAMINOPHEN 500 MG PO TABS
ORAL_TABLET | ORAL | Status: AC
  Filled 2021-12-20: qty 2

## 2021-12-20 MED ORDER — LIDOCAINE 2% (20 MG/ML) 5 ML SYRINGE
INTRAMUSCULAR | Status: DC | PRN
  Administered 2021-12-20: 60 mg via INTRAVENOUS

## 2021-12-20 MED ORDER — DEXAMETHASONE SODIUM PHOSPHATE 10 MG/ML IJ SOLN
INTRAMUSCULAR | Status: AC
  Filled 2021-12-20: qty 1

## 2021-12-20 MED ORDER — OXYCODONE HCL 5 MG PO TABS: 5.0000 mg | ORAL_TABLET | Freq: Once | ORAL | Status: DC | PRN

## 2021-12-20 MED ORDER — TRAMADOL HCL 50 MG PO TABS: 50.0000 mg | ORAL_TABLET | Freq: Four times a day (QID) | ORAL | 0 refills | Status: DC | PRN

## 2021-12-20 MED ORDER — SCOPOLAMINE 1 MG/3DAYS TD PT72
MEDICATED_PATCH | TRANSDERMAL | Status: AC
  Filled 2021-12-20: qty 1

## 2021-12-20 MED ORDER — HYDROMORPHONE HCL 1 MG/ML IJ SOLN: 0.2500 mg | INTRAMUSCULAR | Status: DC | PRN

## 2021-12-20 MED ORDER — LIDOCAINE HCL (PF) 2 % IJ SOLN
INTRAMUSCULAR | Status: AC
  Filled 2021-12-20: qty 5

## 2021-12-20 MED ORDER — KETOROLAC TROMETHAMINE 30 MG/ML IJ SOLN: 30.0000 mg | Freq: Once | INTRAMUSCULAR | Status: DC | PRN

## 2021-12-20 MED ORDER — OXYCODONE HCL 5 MG/5ML PO SOLN: 5.0000 mg | Freq: Once | ORAL | Status: DC | PRN

## 2021-12-20 MED ORDER — MIDAZOLAM HCL 2 MG/2ML IJ SOLN
INTRAMUSCULAR | Status: DC | PRN
  Administered 2021-12-20: 2 mg via INTRAVENOUS

## 2021-12-20 MED ORDER — SCOPOLAMINE 1 MG/3DAYS TD PT72
1.0000 | MEDICATED_PATCH | TRANSDERMAL | Status: DC
  Administered 2021-12-20: 1.5 mg via TRANSDERMAL

## 2021-12-20 MED ORDER — FENTANYL CITRATE (PF) 100 MCG/2ML IJ SOLN
INTRAMUSCULAR | Status: AC
  Filled 2021-12-20: qty 2

## 2021-12-20 MED ORDER — AMISULPRIDE (ANTIEMETIC) 5 MG/2ML IV SOLN: 10.0000 mg | Freq: Once | INTRAVENOUS | Status: DC | PRN

## 2021-12-20 MED ORDER — SUCCINYLCHOLINE CHLORIDE 200 MG/10ML IV SOSY
PREFILLED_SYRINGE | INTRAVENOUS | Status: DC | PRN
  Administered 2021-12-20: 100 mg via INTRAVENOUS

## 2021-12-20 MED ORDER — ONDANSETRON HCL 4 MG/2ML IJ SOLN
INTRAMUSCULAR | Status: DC | PRN
  Administered 2021-12-20: 4 mg via INTRAVENOUS

## 2021-12-20 MED ORDER — ONDANSETRON HCL 4 MG/2ML IJ SOLN: 4.0000 mg | Freq: Once | INTRAMUSCULAR | Status: DC | PRN

## 2021-12-20 MED ORDER — ONDANSETRON HCL 4 MG/2ML IJ SOLN
INTRAMUSCULAR | Status: AC
  Filled 2021-12-20: qty 2

## 2021-12-20 MED ORDER — ROCURONIUM BROMIDE 10 MG/ML (PF) SYRINGE
PREFILLED_SYRINGE | INTRAVENOUS | Status: DC | PRN
  Administered 2021-12-20: 30 mg via INTRAVENOUS

## 2021-12-20 MED ORDER — SODIUM CHLORIDE 0.9% FLUSH: 3.0000 mL | Freq: Two times a day (BID) | INTRAVENOUS | Status: DC

## 2021-12-20 SURGICAL SUPPLY — 52 items
APL SKNCLS STERI-STRIP NONHPOA (GAUZE/BANDAGES/DRESSINGS) ×1
BENZOIN TINCTURE PRP APPL 2/3 (GAUZE/BANDAGES/DRESSINGS) ×2 IMPLANT
BLADE EXTENDED COATED 6.5IN (ELECTRODE) IMPLANT
BLADE SURG 10 STRL SS (BLADE) IMPLANT
BRIEF MESH DISP LRG (UNDERPADS AND DIAPERS) ×1 IMPLANT
COVER BACK TABLE 60X90IN (DRAPES) ×1 IMPLANT
COVER MAYO STAND STRL (DRAPES) ×1 IMPLANT
DRAPE LAPAROTOMY 100X72 PEDS (DRAPES) ×1 IMPLANT
DRAPE UTILITY XL STRL (DRAPES) ×1 IMPLANT
ELECT REM PT RETURN 9FT ADLT (ELECTROSURGICAL) ×1
ELECTRODE REM PT RTRN 9FT ADLT (ELECTROSURGICAL) ×1 IMPLANT
GAUZE 4X4 16PLY ~~LOC~~+RFID DBL (SPONGE) ×1 IMPLANT
GAUZE PAD ABD 8X10 STRL (GAUZE/BANDAGES/DRESSINGS) ×1 IMPLANT
GAUZE SPONGE 4X4 12PLY STRL (GAUZE/BANDAGES/DRESSINGS) ×1 IMPLANT
GAUZE SPONGE 4X4 12PLY STRL LF (GAUZE/BANDAGES/DRESSINGS) IMPLANT
GLOVE BIO SURGEON STRL SZ 6.5 (GLOVE) ×1 IMPLANT
GLOVE BIOGEL PI IND STRL 7.0 (GLOVE) ×1 IMPLANT
GLOVE BIOGEL PI IND STRL 7.5 (GLOVE) IMPLANT
GLOVE INDICATOR 6.5 STRL GRN (GLOVE) ×1 IMPLANT
GOWN STRL REUS W/TWL XL LVL3 (GOWN DISPOSABLE) ×1 IMPLANT
HYDROGEN PEROXIDE 16OZ (MISCELLANEOUS) ×1 IMPLANT
IV CATH 14GX2 1/4 (CATHETERS) ×1 IMPLANT
IV CATH 18G SAFETY (IV SOLUTION) ×1 IMPLANT
KIT SIGMOIDOSCOPE (SET/KITS/TRAYS/PACK) IMPLANT
KIT TURNOVER CYSTO (KITS) ×1 IMPLANT
LOOP VASCLR MAXI BLUE 18IN ST (MISCELLANEOUS) IMPLANT
LOOP VASCULAR MAXI 18 BLUE (MISCELLANEOUS)
LOOPS VASCLR MAXI BLUE 18IN ST (MISCELLANEOUS) IMPLANT
NEEDLE HYPO 22GX1.5 SAFETY (NEEDLE) ×1 IMPLANT
NS IRRIG 500ML POUR BTL (IV SOLUTION) ×1 IMPLANT
PACK BASIN DAY SURGERY FS (CUSTOM PROCEDURE TRAY) ×1 IMPLANT
PAD ARMBOARD 7.5X6 YLW CONV (MISCELLANEOUS) IMPLANT
PENCIL SMOKE EVACUATOR (MISCELLANEOUS) ×1 IMPLANT
SPIKE FLUID TRANSFER (MISCELLANEOUS) ×1 IMPLANT
SPONGE HEMORRHOID 8X3CM (HEMOSTASIS) IMPLANT
SPONGE SURGIFOAM ABS GEL 12-7 (HEMOSTASIS) IMPLANT
SUCTION FRAZIER HANDLE 10FR (MISCELLANEOUS)
SUCTION TUBE FRAZIER 10FR DISP (MISCELLANEOUS) IMPLANT
SUT CHROMIC 2 0 SH (SUTURE) IMPLANT
SUT CHROMIC 3 0 SH 27 (SUTURE) IMPLANT
SUT ETHIBOND 0 (SUTURE) IMPLANT
SUT VIC AB 2-0 SH 27 (SUTURE)
SUT VIC AB 2-0 SH 27XBRD (SUTURE) IMPLANT
SUT VIC AB 3-0 SH 18 (SUTURE) IMPLANT
SUT VIC AB 3-0 SH 27 (SUTURE)
SUT VIC AB 3-0 SH 27XBRD (SUTURE) IMPLANT
SYR CONTROL 10ML LL (SYRINGE) ×1 IMPLANT
TOWEL OR 17X26 10 PK STRL BLUE (TOWEL DISPOSABLE) ×1 IMPLANT
TRAY DSU PREP LF (CUSTOM PROCEDURE TRAY) ×1 IMPLANT
TUBE CONNECTING 12X1/4 (SUCTIONS) ×1 IMPLANT
VASCULAR TIE MAXI BLUE 18IN ST (MISCELLANEOUS)
YANKAUER SUCT BULB TIP NO VENT (SUCTIONS) ×1 IMPLANT

## 2021-12-20 NOTE — Op Note (Signed)
12/20/2021  1:42 PM  PATIENT:  Kathleen Cooper  41 y.o. female  Patient Care Team: Jeanie Sewer, NP as PCP - General (Family Medicine)  PRE-OPERATIVE DIAGNOSIS:  Anal fistula  POST-OPERATIVE DIAGNOSIS:  Anal fistula  PROCEDURE:  EXAM UNDER ANESTHESIA WITH ANAL FISTULOTOMY   Surgeon(s): Leighton Ruff, MD  ASSISTANT: none   ANESTHESIA:   local and general  SPECIMEN:  No Specimen  DISPOSITION OF SPECIMEN:  N/A  COUNTS:  YES  PLAN OF CARE: Discharge to home after PACU  PATIENT DISPOSITION:  PACU - hemodynamically stable.  INDICATION: 41 y.o. 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  General anesthesia was induced without difficulty. The patient was then positioned in prone jackknife position with buttocks gently taped apart.  The patient was then prepped and draped in usual sterile fashion.  SCDs were noted to be in place prior to the initiation of anesthesia. A surgical timeout was performed indicating the correct patient, procedure, positioning and need for preoperative antibiotics.  A rectal block was performed using Marcaine with epinephrine.    I began with a digital rectal exam.  The patient had some sphincter hypertension and a palpable internal opening at posterior midline.  I then placed a Hill-Ferguson anoscope into the anal canal and evaluated this completely.  There was no other pathology noted except for the internal opening.  A fistula probe was inserted into the external opening and easily traversed internally.  There was minimal superficial external sphincter involvement.  A fistulotomy was performed using electrocautery.  The sphincter was tacked to the fistula tract using interrupted 3-0 Vicryl suture.  The skin was  marsupialized to the fistula tract using a running 2-0 chromic suture.  Additional Marcaine was placed around the incision site.  A dressing was applied.  The patient was then awakened from anesthesia and sent to the postanesthesia care unit in stable condition.  All counts were correct per operating room staff.     Rosario Adie, MD  Colorectal and [facility]

## 2021-12-20 NOTE — Discharge Instructions (Addendum)
ANORECTAL SURGERY: POST OP INSTRUCTIONS Take your usually prescribed home medications unless otherwise directed. DIET: During the first few hours after surgery sip on some liquids until you are able to urinate.  It is normal to not urinate for several hours after this surgery.  If you feel uncomfortable, please contact the office for instructions.  After you are able to urinate,you may eat, if you feel like it.  Follow a light bland diet the first 24 hours after arrival home, such as soup, liquids, crackers, etc.  Be sure to include lots of fluids daily (6-8 glasses).  Avoid fast food or heavy meals, as your are more likely to get nauseated.  Eat a low fat diet the next few days after surgery.  Limit caffeine intake to 1-2 servings a day. PAIN CONTROL: Pain is best controlled by a usual combination of several different methods TOGETHER: Muscle relaxation: Soak in a warm bath (or Sitz bath) three times a day and after bowel movements.  Continue to do this until all pain is resolved. Over the counter pain medication Prescription pain medication Most patients will experience some swelling and discomfort in the anus/rectal area and incisions.  Heat such as warm towels, sitz baths, warm baths, etc to help relax tight/sore spots and speed recovery.  Some people prefer to use ice, especially in the first couple days after surgery, as it may decrease the pain and swelling, or alternate between ice & heat.  Experiment to what works for you.  Swelling and bruising can take several weeks to resolve.  Pain can take even longer to completely resolve. It is helpful to take an over-the-counter pain medication regularly for the first few weeks.  Choose one of the following that works best for you: Naproxen (Aleve, etc)  Two 220mg tabs twice a day Ibuprofen (Advil, etc) Three 200mg tabs four times a day (every meal & bedtime) A  prescription for pain medication (such as percocet, oxycodone, hydrocodone, etc) should be  given to you upon discharge.  Take your pain medication as prescribed.  If you are having problems/concerns with the prescription medicine (does not control pain, nausea, vomiting, rash, itching, etc), please call us [phone] to see if we need to switch you to a different pain medicine that will work better for you and/or control your side effect better. If you need a refill on your pain medication, please contact your pharmacy.  They will contact our office to request authorization. Prescriptions will not be filled after 5 pm or on week-ends. KEEP YOUR BOWELS REGULAR and AVOID CONSTIPATION The goal is one to two soft bowel movements a day.  You should at least have a bowel movement every other day. Avoid getting constipated.  Between the surgery and the pain medications, it is common to experience some constipation. This can be very painful after rectal surgery.  Increasing fluid intake and taking a fiber supplement (such as Metamucil, Citrucel, FiberCon, etc) 1-2 times a day regularly will usually help prevent this problem from occurring.  A stool softener like colace is also recommended.  This can be purchased over the counter at your pharmacy.  You can take it up to 3 times a day.  If you do not have a bowel movement after 24 hrs since your surgery, take one does of milk of magnesia.  If you still haven't had a bowel movement 8-12 hours after that dose, take another dose.  If you don't have a bowel movement 48 hrs after surgery,   purchase a Fleets enema from the drug store and administer gently per package instructions.  If you still are having trouble with your bowel movements after that, please call the office for further instructions. If you develop diarrhea or have many loose bowel movements, simplify your diet to bland foods & liquids for a few days.  Stop any stool softeners and decrease your fiber supplement.  Switching to mild anti-diarrheal medications (Kayopectate, Pepto Bismol) can help.   If this worsens or does not improve, please call us.  Wound Care Remove your bandages before your first bowel movement or 8 hours after surgery.     Remove any wound packing material at this tim,e as well.  You do not need to repack the wound unless instructed otherwise.  Wear an absorbent pad or soft cotton gauze in your underwear to catch any drainage and help keep the area clean. You should change this every 2-3 hours while awake. Keep the area clean and dry.  Bathe / shower every day, especially after bowel movements.  Keep the area clean by showering / bathing over the incision / wound.   It is okay to soak an open wound to help wash it.  Wet wipes or showers / gentle washing after bowel movements is often less traumatic than regular toilet paper. You may have some styrofoam-like soft packing in the rectum which will come out with the first bowel movement.  You will often notice bleeding with bowel movements.  This should slow down by the end of the first week of surgery Expect some drainage.  This should slow down, too, by the end of the first week of surgery.  Wear an absorbent pad or soft cotton gauze in your underwear until the drainage stops. Do Not sit on a rubber or pillow ring.  This can make you symptoms worse.  You may sit on a soft pillow if needed.  ACTIVITIES as tolerated:   You may resume regular (light) daily activities beginning the next day--such as daily self-care, walking, climbing stairs--gradually increasing activities as tolerated.  If you can walk 30 minutes without difficulty, it is safe to try more intense activity such as jogging, treadmill, bicycling, low-impact aerobics, swimming, etc. Save the most intensive and strenuous activity for last such as sit-ups, heavy lifting, contact sports, etc  Refrain from any heavy lifting or straining until you are off narcotics for pain control.   You may drive when you are no longer taking prescription pain medication, you can  comfortably sit for long periods of time, and you can safely maneuver your car and apply brakes. You may have sexual intercourse when it is comfortable.  FOLLOW UP in our office Please call [facility] at [phone] to set up an appointment to see your surgeon in the office for a follow-up appointment approximately 3-4 weeks after your surgery. Make sure that you call for this appointment the day you arrive home to insure a convenient appointment time. 10. IF YOU HAVE DISABILITY OR FAMILY LEAVE FORMS, BRING THEM TO THE OFFICE FOR PROCESSING.  DO NOT GIVE THEM TO YOUR DOCTOR.     WHEN TO CALL us [phone]: Poor pain control Reactions / problems with new medications (rash/itching, nausea, etc)  Fever over 101.5 F (38.5 C) Inability to urinate Nausea and/or vomiting Worsening swelling or bruising Continued bleeding from incision. Increased pain, redness, or drainage from the incision  The clinic staff is available to answer your questions during regular business hours (8:30am-5pm).  Please don't hesitate to call and ask to speak to one of our nurses for clinical concerns.   A surgeon from [facility] is always on call at the hospitals   If you have a medical emergency, go to the nearest emergency room or call 911.    [facility], [address], [place], [place], [place]  [address] ? MAIN: [phone] ? TOLL FREE: [phone] ? FAX [phone] [email or web address]    Post Anesthesia Home Care Instructions  Activity: Get plenty of rest for the remainder of the day. A responsible individual must stay with you for 24 hours following the procedure.  For the next 24 hours, DO NOT: -Drive a car -Paediatric nurse -Drink alcoholic beverages -Take any medication unless instructed by your physician -Make any legal decisions or sign important papers.  Meals: Start with liquid foods such as gelatin or soup. Progress to regular  foods as tolerated. Avoid greasy, spicy, heavy foods. If nausea and/or vomiting occur, drink only clear liquids until the nausea and/or vomiting subsides. Call your physician if vomiting continues.  Special Instructions/Symptoms: Your throat may feel dry or sore from the anesthesia or the breathing tube placed in your throat during surgery. If this causes discomfort, gargle with warm salt water. The discomfort should disappear within 24 hours.  If you had a scopolamine patch placed behind your ear for the management of post- operative nausea and/or vomiting:  1. The medication in the patch is effective for 72 hours, after which it should be removed.  Wrap patch in a tissue and discard in the trash. Wash hands thoroughly with soap and water. 2. You may remove the patch earlier than 72 hours if you experience unpleasant side effects which may include dry mouth, dizziness or visual disturbances. 3. Avoid touching the patch. Wash your hands with soap and water after contact with the patch.   No acetaminophen/Tylenol until after 6:30 pm today if needed.

## 2021-12-20 NOTE — Anesthesia Procedure Notes (Signed)
Procedure Name: Intubation Date/Time: 12/20/2021 1:12 PM  Performed by: Suan Halter, CRNAPre-anesthesia Checklist: Patient identified, Emergency Drugs available, Suction available and Patient being monitored Patient Re-evaluated:Patient Re-evaluated prior to induction Oxygen Delivery Method: Circle system utilized Preoxygenation: Pre-oxygenation with 100% oxygen Induction Type: IV induction and Rapid sequence Ventilation: Mask ventilation without difficulty Laryngoscope Size: Mac and 3 Grade View: Grade I Tube type: Oral Tube size: 7.0 mm Number of attempts: 1 Airway Equipment and Method: Stylet and Oral airway Placement Confirmation: ETT inserted through vocal cords under direct vision, positive ETCO2 and breath sounds checked- equal and bilateral Secured at: 22 cm Tube secured with: Tape Dental Injury: Teeth and Oropharynx as per pre-operative assessment

## 2021-12-20 NOTE — Interval H&P Note (Signed)
History and Physical Interval Note:  12/20/2021 12:33 PM  Kathleen Cooper  has presented today for surgery, with the diagnosis of ANAL FISTULA.  The various methods of treatment have been discussed with the patient and family. After consideration of risks, benefits and other options for treatment, the patient has consented to  Procedure(s): EXAM UNDER ANESTHESIA WITH ANAL FISTULECTOMY (N/A) as a surgical intervention.  The patient's history has been reviewed, patient examined, no change in status, stable for surgery.  I have reviewed the patient's chart and labs.  Questions were answered to the patient's satisfaction.     Rosario Adie, MD  Colorectal and [facility]

## 2021-12-20 NOTE — Transfer of Care (Signed)
Immediate Anesthesia Transfer of Care Note  Patient: Kathleen Cooper  Procedure(s) Performed: Procedure(s) (LRB): EXAM UNDER ANESTHESIA WITH ANAL FISTULECTOMY (N/A)  Patient Location: PACU  Anesthesia Type: General  Level of Consciousness: awake, oriented, sedated and patient cooperative  Airway & Oxygen Therapy: Patient Spontanous Breathing and Patient connected to face mask oxygen  Post-op Assessment: Report given to PACU RN and Post -op Vital signs reviewed and stable  Post vital signs: Reviewed and stable  Complications: No apparent anesthesia complications Last Vitals:  Vitals Value Taken Time  BP 133/79 12/20/21 1353  Temp    Pulse 113 12/20/21 1359  Resp 16 12/20/21 1359  SpO2 100 % 12/20/21 1359  Vitals shown include unvalidated device data.  Last Pain:  Vitals:   12/20/21 1240  TempSrc: Oral  PainSc: 0-No pain      Patients Stated Pain Goal: 5 (33/43/56 8616)  Complications: No notable events documented.

## 2021-12-20 NOTE — Anesthesia Postprocedure Evaluation (Signed)
Anesthesia Post Note  Patient: Kathleen Cooper  Procedure(s) Performed: EXAM UNDER ANESTHESIA WITH ANAL FISTULECTOMY     Patient location during evaluation: PACU Anesthesia Type: General Level of consciousness: awake and alert, oriented and patient cooperative Pain management: pain level controlled Vital Signs Assessment: post-procedure vital signs reviewed and stable Respiratory status: spontaneous breathing, nonlabored ventilation and respiratory function stable Cardiovascular status: blood pressure returned to baseline and stable Postop Assessment: no apparent nausea or vomiting Anesthetic complications: no   No notable events documented.  Last Vitals:  Vitals:   12/20/21 1352 12/20/21 1400  BP: 133/79 116/82  Pulse: (!) 121 (!) 105  Resp: 10 18  Temp: 36.4 C   SpO2: 100% 100%    Last Pain:  Vitals:   12/20/21 1352  TempSrc:   PainSc: 0-No pain                 Pervis Hocking

## 2021-12-20 NOTE — Anesthesia Preprocedure Evaluation (Addendum)
Anesthesia Evaluation  Patient identified by MRN, date of birth, ID band Patient awake    Reviewed: Allergy & Precautions, NPO status , Patient's Chart, lab work & pertinent test results  History of Anesthesia Complications (+) PONV, PROLONGED EMERGENCE and history of anesthetic complications  Airway Mallampati: I  TM Distance: >3 FB Neck ROM: Full    Dental  (+) Teeth Intact, Dental Advisory Given   Pulmonary former smoker   Pulmonary exam normal breath sounds clear to auscultation       Cardiovascular negative cardio ROS Normal cardiovascular exam Rhythm:Regular Rate:Normal     Neuro/Psych  PSYCHIATRIC DISORDERS Anxiety     negative neurological ROS     GI/Hepatic Neg liver ROS,GERD  Controlled,,  Endo/Other  negative endocrine ROS    Renal/GU negative Renal ROS  negative genitourinary   Musculoskeletal  (+) Arthritis , Rheumatoid disorders,  Fibromyalgia -  Abdominal   Peds  Hematology negative hematology ROS (+)   Anesthesia Other Findings Mounjaro LD- 6d ago  Reproductive/Obstetrics negative OB ROS                             Anesthesia Physical Anesthesia Plan  ASA: 2  Anesthesia Plan: General   Post-op Pain Management: Tylenol PO (pre-op)* and Toradol IV (intra-op)*   Induction: Intravenous and Rapid sequence  PONV Risk Score and Plan: 4 or greater and Ondansetron, Dexamethasone, Midazolam, Scopolamine patch - Pre-op, Treatment may vary due to age or medical condition, Propofol infusion and TIVA  Airway Management Planned: Oral ETT  Additional Equipment: None  Intra-op Plan:   Post-operative Plan: Extubation in OR  Informed Consent: I have reviewed the patients History and Physical, chart, labs and discussed the procedure including the risks, benefits and alternatives for the proposed anesthesia with the patient or authorized representative who has indicated his/her  understanding and acceptance.     Dental advisory given  Plan Discussed with: CRNA  Anesthesia Plan Comments: (Has not been off mounjaro for the recommended 7d, will proceed w/ RSI ETT)       Anesthesia Quick Evaluation

## 2021-12-24 ENCOUNTER — Encounter (HOSPITAL_BASED_OUTPATIENT_CLINIC_OR_DEPARTMENT_OTHER): Payer: Self-pay | Admitting: General Surgery

## 2021-12-24 ENCOUNTER — Other Ambulatory Visit: Payer: Self-pay | Admitting: Family

## 2021-12-24 DIAGNOSIS — R635 Abnormal weight gain: Secondary | ICD-10-CM

## 2022-01-01 ENCOUNTER — Encounter (HOSPITAL_BASED_OUTPATIENT_CLINIC_OR_DEPARTMENT_OTHER): Payer: Self-pay | Admitting: General Surgery

## 2022-01-01 NOTE — Addendum Note (Signed)
Addendum  created 01/01/22 0858 by Pervis Hocking, DO   Intraprocedure Event edited, Intraprocedure Staff edited

## 2022-01-05 ENCOUNTER — Other Ambulatory Visit: Payer: Self-pay | Admitting: Family

## 2022-01-05 DIAGNOSIS — R635 Abnormal weight gain: Secondary | ICD-10-CM

## 2022-01-08 ENCOUNTER — Other Ambulatory Visit: Payer: Self-pay | Admitting: Family

## 2022-01-08 DIAGNOSIS — R635 Abnormal weight gain: Secondary | ICD-10-CM

## 2022-01-09 NOTE — Telephone Encounter (Signed)
I have a new insurance, and in order for them to cover it, they asked for prior authorization. The pharmacy informed me that they have sent you multiple request for prior authorization, in order to refill the medication. Can you please send the prior authorization as soon as you can. I am out of the medication currently. Thank you

## 2022-01-10 ENCOUNTER — Encounter: Payer: Self-pay | Admitting: Family

## 2022-01-11 NOTE — Telephone Encounter (Signed)
did  you get a message from her pharmacy?  please start prior auth for her Darcel Bayley if not already, thx.

## 2022-01-14 NOTE — Telephone Encounter (Signed)
Tum[place]ori-Carmen, thanks. Is there a way to follow up on the PA? If you can let Yasheka know. She should contact her insurance company and ask if they are going to approve or not to speed things along.

## 2022-01-20 ENCOUNTER — Encounter: Payer: Self-pay | Admitting: Family

## 2022-01-20 ENCOUNTER — Ambulatory Visit: Payer: 59 | Admitting: Family

## 2022-01-20 VITALS — BP 103/69 | HR 75 | Temp 97.4°F | Ht 62.0 in | Wt 137.1 lb

## 2022-01-20 DIAGNOSIS — R635 Abnormal weight gain: Secondary | ICD-10-CM | POA: Diagnosis not present

## 2022-01-20 DIAGNOSIS — E782 Mixed hyperlipidemia: Secondary | ICD-10-CM | POA: Insufficient documentation

## 2022-01-20 LAB — COMPREHENSIVE METABOLIC PANEL
ALT: 14 U/L (ref 0–35)
AST: 14 U/L (ref 0–37)
Albumin: 4.1 g/dL (ref 3.5–5.2)
Alkaline Phosphatase: 58 U/L (ref 39–117)
BUN: 6 mg/dL (ref 6–23)
CO2: 28 mEq/L (ref 19–32)
Calcium: 9.2 mg/dL (ref 8.4–10.5)
Chloride: 103 mEq/L (ref 96–112)
Creatinine, Ser: 0.76 mg/dL (ref 0.40–1.20)
GFR: 97.12 mL/min (ref >60.00)
Glucose, Bld: 96 mg/dL (ref 70–99)
Potassium: 4 mEq/L (ref 3.5–5.1)
Sodium: 139 mEq/L (ref 135–145)
Total Bilirubin: 0.9 mg/dL (ref 0.2–1.2)
Total Protein: 6.5 g/dL (ref 6.0–8.3)

## 2022-01-20 LAB — LIPID PANEL
Cholesterol: 200 mg/dL (ref 0–200)
HDL: 56.4 mg/dL (ref >39.00)
LDL Cholesterol: 131 mg/dL — ABNORMAL HIGH (ref 0–99)
NonHDL: 144.08
Total CHOL/HDL Ratio: 4
Triglycerides: 63 mg/dL (ref 0.0–149.0)
VLDL: 12.6 mg/dL (ref 0.0–40.0)

## 2022-01-20 LAB — TSH: TSH: 2.17 u[IU]/mL (ref 0.35–5.50)

## 2022-01-20 NOTE — Progress Notes (Signed)
Patient ID: Kathleen Cooper, female    DOB: 1980/05/03, 42 y.o.   MRN: [id number]  Chief Complaint  Patient presents with   Hyperlipidemia    6 month recheck, Fasting w/ Labs    Weight Loss    Mounjaro     HPI: Abnormal weight gain: Patient complains of weight gain. Patient cites health, increased physical ability, self-image as reasons for wanting to lose weight. Greatest amount of weight lost: 30 lb over the last year. Circumstances associated with regain of weight: stress. Successful weight loss techniques attempted: prescription appetite suppressants: Mounjaro and supervised diet program. Exercise: walking. Number of regular meals per day: 3. Use of medications that may cause weight gain: none. Last visit pt was tolerating Mounjaro, up to 7.'5mg'$ , wt down another 10lbs, however, she now has Medicaid and they will not cover the [facility] for wt loss. Hyperlipidemia: Patient is currently maintained on the following medication for hyperlipidemia: none.  Patient reports good compliance with low fat/low cholesterol diet.  Last lipid panel as follows: Lab Results  Component Value Date   CHOL 231 (H) 04/17/2021   HDL 58.90 04/17/2021   LDLCALC 157 (H) 04/17/2021   TRIG 76.0 04/17/2021   CHOLHDL 4 04/17/2021    Assessment & Plan:   Problem List Items Addressed This Visit       Other   Abnormal weight gain - Primary    Chronic wt down another 10lbs on Mounjaro 7.'5mg'$ , but she has run out and has new ins that won't cover med very close to her goal of 125lbs, advised to continue eating low carb diet, exercise, drink 2L water qd f/u prn      Relevant Orders   TSH   Mixed hyperlipidemia    chronic 04/2021 - chol 231, LDL 157 lifestyle modifications, pt has lost weight rechecking labs today f/u 1 yr      Relevant Orders   Lipid panel   Comp Met (CMET)    Subjective:    Outpatient Medications Prior to Visit  Medication Sig Dispense Refill   bisacodyl (DULCOLAX) 5 MG EC tablet  Take 5 mg by mouth daily as needed for moderate constipation.      cyanocobalamin (,VITAMIN B-12,) 1000 MCG/ML injection INJECT 1ML (1000MCG) WEEKLY FOR 4 WEEKS, THEN MONTHLY.     ibuprofen (ADVIL,MOTRIN) 200 MG tablet Take 800 mg by mouth every 6 (six) hours as needed for fever, mild pain or moderate pain.     Magnesium Bisglycinate 100 MG TABS      methylphenidate 54 MG PO CR tablet Take 54 mg by mouth every morning.     Multiple Vitamins-Minerals (EMERGEN-C IMMUNE PO) Take by mouth.     ondansetron (ZOFRAN-ODT) 4 MG disintegrating tablet Take 4 mg by mouth every 8 (eight) hours as needed.     Prenatal Vit-Fe Fum-FA-Omega (ONE-A-DAY WOMENS PRENATAL PO)      Vitamin D, Ergocalciferol, (DRISDOL) 1.25 MG (50000 UNIT) CAPS capsule TAKE 1 CAPSULE BY MOUTH ONE TIME PER WEEK 12 capsule 0   Ferrous Sulfate (IRON PO) Take 1 tablet by mouth daily.     MAGNESIUM PO Take by mouth.     tirzepatide (MOUNJARO) 10 MG/0.5ML Pen INJECT 10 MG INTO THE SKIN ONE TIME PER WEEK (Patient not taking: Reported on 01/20/2022) 0.5 mL 0   traMADol (ULTRAM) 50 MG tablet Take 1-2 tablets (50-100 mg total) by mouth every 6 (six) hours as needed. 30 tablet 0   No facility-administered medications prior to visit.  Past Medical History:  Diagnosis Date   ADHD (attention deficit hyperactivity disorder)    Arthritis    not sure   Chest pain 20/94/7096   Complication of anesthesia    states has been hard to wake up post-op   Constipation 12/12/2011   chronic   Dental crowns present    Double vision 05/30/2014   Last Assessment & Plan:  Formatting of this note might be different from the original. Normal reports from optho. Will refer to neurology for consideration of further evaluation   Fibromyalgia 06/27/2013   Gallstones 12/07/2011   Generalized anxiety disorder 10/02/2014   GERD (gastroesophageal reflux disease)    Mixed hyperlipidemia 01/20/2022   Murmur    Myalgia 06/27/2013   Palpitations 09/27/2010   PONV  (postoperative nausea and vomiting)    Rheumatoid factor positive 06/27/2013   SOB (shortness of breath) 09/27/2010   Symptomatic cholelithiasis 12/01/2011   Tachycardia    hx. 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  not currently on supplement.  Will recheck today.   Past Surgical History:  Procedure Laterality Date   CESAREAN SECTION  12/19/2010   Procedure: CESAREAN SECTION;  Surgeon: Logan Bores;  Location: [facility];  Service: Gynecology;;  Primary cesarean section.    CHOLECYSTECTOMY  12/18/2011   Procedure: LAPAROSCOPIC CHOLECYSTECTOMY;  Surgeon: Harl Bowie, MD;  Location: [facility];  Service: General;  Laterality: N/A;   EVALUATION UNDER ANESTHESIA WITH ANAL FISTULECTOMY N/A 12/20/2021   Procedure: EXAM UNDER ANESTHESIA WITH ANAL FISTULECTOMY;  Surgeon: Leighton Ruff, MD;  Location: [facility];  Service: General;  Laterality: N/A;   LAPAROSCOPY  01/07/2004   No Known Allergies    Objective:    Physical Exam Vitals and nursing note reviewed.  Constitutional:      Appearance: Normal appearance. She is obese.  Cardiovascular:     Rate and Rhythm: Normal rate and regular rhythm.  Pulmonary:     Effort: Pulmonary effort is normal.     Breath sounds: Normal breath sounds.  Musculoskeletal:        General: Normal range of motion.  Skin:    General: Skin is warm and dry.  Neurological:     Mental Status: She is alert.  Psychiatric:        Mood and Affect: Mood normal.        Behavior: Behavior normal.   BP 103/69 (BP Location: Left Arm, Patient Position: Sitting, Cuff Size: Large)   Pulse 75   Temp (!) 97.4 F (36.3 C) (Temporal)   Ht '5\' 2"'$  (1.575 m)   Wt 137 lb 2 oz (62.2 kg)   LMP 01/07/2022 (Exact Date)   SpO2 100%   BMI  25.08 kg/m  Wt Readings from Last 3 Encounters:  01/20/22 137 lb 2 oz (62.2 kg)  12/20/21 136 lb 9.6 oz (62 kg)  09/17/21 133 lb 12.8 oz (60.7 kg)      Jeanie Sewer, NP

## 2022-01-20 NOTE — Assessment & Plan Note (Signed)
chronic 04/2021 - chol 231, LDL 157 lifestyle modifications, pt has lost weight rechecking labs today f/u 1 yr

## 2022-01-20 NOTE — Assessment & Plan Note (Addendum)
Chronic wt down another 10lbs on Mounjaro 7.'5mg'$ , but she has run out and has new ins that won't cover med very close to her goal of 125lbs, advised to continue eating low carb diet, exercise, drink 2L water qd f/u prn

## 2022-01-21 NOTE — Progress Notes (Signed)
Your overall cholesterol numbers are in normal range and just your LDL (bad #) is slightly high, but much improved from last time. Keep trying to reduce any fried foods, alcohol, nonnutritional snacks e.g. chips/cookies,pies, cakes and candies, fatty meat (red meat), high fat dairy foods:  including cheese, milk, ice cream.

## 2022-06-22 ENCOUNTER — Other Ambulatory Visit: Payer: Self-pay | Admitting: Family

## 2022-06-22 DIAGNOSIS — R635 Abnormal weight gain: Secondary | ICD-10-CM

## 2022-10-15 ENCOUNTER — Other Ambulatory Visit (HOSPITAL_BASED_OUTPATIENT_CLINIC_OR_DEPARTMENT_OTHER): Payer: Self-pay

## 2022-10-15 ENCOUNTER — Other Ambulatory Visit: Payer: Self-pay

## 2022-10-16 ENCOUNTER — Other Ambulatory Visit (HOSPITAL_BASED_OUTPATIENT_CLINIC_OR_DEPARTMENT_OTHER): Payer: Self-pay

## 2022-10-16 ENCOUNTER — Other Ambulatory Visit: Payer: Self-pay

## 2022-10-16 MED ORDER — WEGOVY 0.5 MG/0.5ML ~~LOC~~ SOAJ
0.5000 mg | SUBCUTANEOUS | 0 refills | Status: AC
  Filled 2022-10-16: qty 2, 28d supply, fill #0
